# Patient Record
Sex: Female | Born: 1950 | Race: White | Hispanic: No | Marital: Married | Smoking: Never smoker
Health system: Southern US, Community
[De-identification: ages and names within clinical notes are randomized; demographics above are authoritative.]

## PROBLEM LIST (undated history)

## (undated) DIAGNOSIS — E079 Disorder of thyroid, unspecified: Secondary | ICD-10-CM

## (undated) HISTORY — PX: MULTIPLE TOOTH EXTRACTIONS: SHX2053

## (undated) HISTORY — DX: Disorder of thyroid, unspecified: E07.9

## (undated) HISTORY — PX: TUBAL LIGATION: SHX77

---

## 1998-11-06 ENCOUNTER — Other Ambulatory Visit: Admission: RE | Admit: 1998-11-06 | Discharge: 1998-11-06 | Payer: Self-pay | Admitting: Gynecology

## 1999-11-21 ENCOUNTER — Encounter: Admission: RE | Admit: 1999-11-21 | Discharge: 1999-11-21 | Payer: Self-pay | Admitting: Family Medicine

## 1999-12-08 ENCOUNTER — Other Ambulatory Visit: Admission: RE | Admit: 1999-12-08 | Discharge: 1999-12-08 | Payer: Self-pay | Admitting: Gynecology

## 2000-09-10 ENCOUNTER — Encounter (INDEPENDENT_AMBULATORY_CARE_PROVIDER_SITE_OTHER): Payer: Self-pay | Admitting: *Deleted

## 2000-09-10 ENCOUNTER — Ambulatory Visit (HOSPITAL_BASED_OUTPATIENT_CLINIC_OR_DEPARTMENT_OTHER): Admission: RE | Admit: 2000-09-10 | Discharge: 2000-09-10 | Payer: Self-pay | Admitting: Surgery

## 2000-11-24 ENCOUNTER — Encounter: Payer: Self-pay | Admitting: Gynecology

## 2000-11-24 ENCOUNTER — Encounter: Admission: RE | Admit: 2000-11-24 | Discharge: 2000-11-24 | Payer: Self-pay | Admitting: Gynecology

## 2001-04-12 ENCOUNTER — Other Ambulatory Visit: Admission: RE | Admit: 2001-04-12 | Discharge: 2001-04-12 | Payer: Self-pay | Admitting: Gynecology

## 2002-05-05 ENCOUNTER — Other Ambulatory Visit: Admission: RE | Admit: 2002-05-05 | Discharge: 2002-05-05 | Payer: Self-pay | Admitting: Gynecology

## 2002-05-12 ENCOUNTER — Encounter: Admission: RE | Admit: 2002-05-12 | Discharge: 2002-05-12 | Payer: Self-pay | Admitting: Family Medicine

## 2002-05-12 ENCOUNTER — Encounter: Payer: Self-pay | Admitting: Family Medicine

## 2003-05-18 ENCOUNTER — Encounter: Payer: Self-pay | Admitting: Family Medicine

## 2003-05-18 ENCOUNTER — Encounter: Admission: RE | Admit: 2003-05-18 | Discharge: 2003-05-18 | Payer: Self-pay | Admitting: Family Medicine

## 2003-06-20 ENCOUNTER — Other Ambulatory Visit: Admission: RE | Admit: 2003-06-20 | Discharge: 2003-06-20 | Payer: Self-pay | Admitting: Gynecology

## 2003-06-21 ENCOUNTER — Encounter: Payer: Self-pay | Admitting: Family Medicine

## 2003-06-21 ENCOUNTER — Encounter: Admission: RE | Admit: 2003-06-21 | Discharge: 2003-06-21 | Payer: Self-pay | Admitting: Family Medicine

## 2004-06-06 ENCOUNTER — Encounter: Admission: RE | Admit: 2004-06-06 | Discharge: 2004-06-06 | Payer: Self-pay | Admitting: Gynecology

## 2004-06-26 ENCOUNTER — Other Ambulatory Visit: Admission: RE | Admit: 2004-06-26 | Discharge: 2004-06-26 | Payer: Self-pay | Admitting: Gynecology

## 2004-12-21 HISTORY — PX: COLONOSCOPY: SHX174

## 2005-07-13 ENCOUNTER — Encounter: Admission: RE | Admit: 2005-07-13 | Discharge: 2005-07-13 | Payer: Self-pay | Admitting: Family Medicine

## 2005-07-31 ENCOUNTER — Encounter: Admission: RE | Admit: 2005-07-31 | Discharge: 2005-07-31 | Payer: Self-pay | Admitting: Family Medicine

## 2005-09-04 ENCOUNTER — Other Ambulatory Visit: Admission: RE | Admit: 2005-09-04 | Discharge: 2005-09-04 | Payer: Self-pay | Admitting: Family Medicine

## 2006-07-16 ENCOUNTER — Encounter: Admission: RE | Admit: 2006-07-16 | Discharge: 2006-07-16 | Payer: Self-pay | Admitting: Family Medicine

## 2006-07-22 ENCOUNTER — Encounter: Admission: RE | Admit: 2006-07-22 | Discharge: 2006-07-22 | Payer: Self-pay | Admitting: Family Medicine

## 2006-07-30 ENCOUNTER — Ambulatory Visit: Payer: Self-pay | Admitting: Gastroenterology

## 2006-08-06 ENCOUNTER — Ambulatory Visit: Payer: Self-pay | Admitting: Gastroenterology

## 2006-10-15 ENCOUNTER — Other Ambulatory Visit: Admission: RE | Admit: 2006-10-15 | Discharge: 2006-10-15 | Payer: Self-pay | Admitting: Family Medicine

## 2007-07-22 ENCOUNTER — Encounter: Admission: RE | Admit: 2007-07-22 | Discharge: 2007-07-22 | Payer: Self-pay | Admitting: Family Medicine

## 2007-10-20 ENCOUNTER — Other Ambulatory Visit: Admission: RE | Admit: 2007-10-20 | Discharge: 2007-10-20 | Payer: Self-pay | Admitting: Family Medicine

## 2008-09-06 ENCOUNTER — Encounter: Payer: Self-pay | Admitting: Family Medicine

## 2008-09-06 ENCOUNTER — Encounter: Admission: RE | Admit: 2008-09-06 | Discharge: 2008-09-06 | Payer: Self-pay | Admitting: Family Medicine

## 2008-10-19 ENCOUNTER — Encounter (INDEPENDENT_AMBULATORY_CARE_PROVIDER_SITE_OTHER): Payer: Self-pay | Admitting: *Deleted

## 2008-10-19 ENCOUNTER — Encounter: Payer: Self-pay | Admitting: Family Medicine

## 2008-10-19 LAB — CONVERTED CEMR LAB
Alkaline Phosphatase: 61 units/L
CO2, serum: 23 mmol/L
Creatinine, Ser: 0.96 mg/dL
HCT: 40.5 %
HDL: 101 mg/dL
LDL Cholesterol: 129 mg/dL
MCH: 33.1 pg
RBC count: 4.48 10*6/uL
Sodium, serum: 140 mmol/L
TSH: 0.75 microintl units/mL
Total Bilirubin: 0.6 mg/dL
Total Protein: 7.5 g/dL
WBC, blood: 5.1 10*3/uL

## 2008-10-23 ENCOUNTER — Other Ambulatory Visit: Admission: RE | Admit: 2008-10-23 | Discharge: 2008-10-23 | Payer: Self-pay | Admitting: Family Medicine

## 2009-09-03 ENCOUNTER — Ambulatory Visit: Payer: Self-pay | Admitting: Family Medicine

## 2009-09-03 DIAGNOSIS — E039 Hypothyroidism, unspecified: Secondary | ICD-10-CM | POA: Insufficient documentation

## 2009-09-03 DIAGNOSIS — M810 Age-related osteoporosis without current pathological fracture: Secondary | ICD-10-CM | POA: Insufficient documentation

## 2009-09-05 ENCOUNTER — Encounter (INDEPENDENT_AMBULATORY_CARE_PROVIDER_SITE_OTHER): Payer: Self-pay | Admitting: *Deleted

## 2009-11-18 ENCOUNTER — Telehealth (INDEPENDENT_AMBULATORY_CARE_PROVIDER_SITE_OTHER): Payer: Self-pay | Admitting: *Deleted

## 2009-12-27 ENCOUNTER — Encounter: Payer: Self-pay | Admitting: Family Medicine

## 2009-12-27 ENCOUNTER — Ambulatory Visit: Payer: Self-pay | Admitting: Family Medicine

## 2009-12-27 ENCOUNTER — Other Ambulatory Visit: Admission: RE | Admit: 2009-12-27 | Discharge: 2009-12-27 | Payer: Self-pay | Admitting: Family Medicine

## 2009-12-27 LAB — HM COLONOSCOPY: HM Colonoscopy: NORMAL

## 2009-12-28 ENCOUNTER — Encounter: Payer: Self-pay | Admitting: Family Medicine

## 2009-12-31 ENCOUNTER — Encounter (INDEPENDENT_AMBULATORY_CARE_PROVIDER_SITE_OTHER): Payer: Self-pay | Admitting: *Deleted

## 2010-01-01 ENCOUNTER — Encounter (INDEPENDENT_AMBULATORY_CARE_PROVIDER_SITE_OTHER): Payer: Self-pay | Admitting: *Deleted

## 2010-01-01 LAB — CONVERTED CEMR LAB: Vit D, 25-Hydroxy: 26 ng/mL — ABNORMAL LOW (ref 30–89)

## 2010-01-03 ENCOUNTER — Encounter (INDEPENDENT_AMBULATORY_CARE_PROVIDER_SITE_OTHER): Payer: Self-pay | Admitting: *Deleted

## 2010-01-06 ENCOUNTER — Encounter (INDEPENDENT_AMBULATORY_CARE_PROVIDER_SITE_OTHER): Payer: Self-pay | Admitting: *Deleted

## 2010-01-06 LAB — CONVERTED CEMR LAB
ALT: 16 units/L (ref 0–35)
AST: 20 units/L (ref 0–37)
Alkaline Phosphatase: 63 units/L (ref 39–117)
Calcium: 9.9 mg/dL (ref 8.4–10.5)
Creatinine, Ser: 0.9 mg/dL (ref 0.4–1.2)
Glucose, Bld: 86 mg/dL (ref 70–99)
HCT: 42.4 % (ref 36.0–46.0)
Lymphocytes Relative: 30.2 % (ref 12.0–46.0)
Lymphs Abs: 1.6 10*3/uL (ref 0.7–4.0)
MCV: 93.7 fL (ref 78.0–100.0)
Monocytes Relative: 9.4 % (ref 3.0–12.0)
Neutrophils Relative %: 58.7 % (ref 43.0–77.0)
Platelets: 329 10*3/uL (ref 150.0–400.0)
RDW: 12.1 % (ref 11.5–14.6)
Sodium: 142 meq/L (ref 135–145)
Triglycerides: 89 mg/dL (ref 0.0–149.0)
VLDL: 17.8 mg/dL (ref 0.0–40.0)

## 2010-08-15 ENCOUNTER — Encounter: Admission: RE | Admit: 2010-08-15 | Discharge: 2010-08-15 | Payer: Self-pay | Admitting: Family Medicine

## 2010-08-15 LAB — HM MAMMOGRAPHY: HM Mammogram: NORMAL

## 2011-01-11 ENCOUNTER — Encounter: Payer: Self-pay | Admitting: Family Medicine

## 2011-01-14 ENCOUNTER — Encounter: Payer: Self-pay | Admitting: Family Medicine

## 2011-01-20 NOTE — Letter (Signed)
Summary: Results Follow up Letter  [facility]  [address] [place], [place] [address]   Phone: [phone]  Fax: [phone]    01/03/2010 MRN: [id number]  Kerri Garrett [address] [place], [place]  [address]  Dear Ms. Curb,  The following are the results of your recent test(s):  Test         Result    Pap Smear:        Normal __X___  Not Normal _____ Comments: ______________________________________________________ Cholesterol: LDL(Bad cholesterol):         Your goal is less than:         HDL (Good cholesterol):       Your goal is more than: Comments:  ______________________________________________________ Mammogram:        Normal _____  Not Normal _____ Comments:  ___________________________________________________________________ Hemoccult:        Normal _____  Not normal _______ Comments:    _____________________________________________________________________ Other Tests:    We routinely do not discuss normal results over the telephone.  If you desire a copy of the results, or you have any questions about this information we can discuss them at your next office visit.   Sincerely,    Army Fossa CMA  January 03, 2010 7:56 AM

## 2011-01-20 NOTE — Letter (Signed)
Summary: Results Follow up Letter  [facility]  [address] [place], [place] [address]   Phone: [phone]  Fax: [phone]    12/31/2009 MRN: [id number]  Kerri Garrett [address] [place], [place]  [address]  Dear Ms. Stalvey,  The following are the results of your recent test(s):  Test         Result    Pap Smear:        Normal _____  Not Normal _____ Comments: ______________________________________________________ Cholesterol: LDL(Bad cholesterol):         Your goal is less than:         HDL (Good cholesterol):       Your goal is more than: Comments:  ______________________________________________________ Mammogram:        Normal _____  Not Normal _____ Comments:  ___________________________________________________________________ Hemoccult:        Normal _____  Not normal _______ Comments:    _____________________________________________________________________ Other Tests:  See attachment for results.   We routinely do not discuss normal results over the telephone.  If you desire a copy of the results, or you have any questions about this information we can discuss them at your next office visit.   Sincerely,    Army Fossa CMA  December 31, 2009 11:35 AM

## 2011-01-20 NOTE — Letter (Signed)
Summary: Results Follow up Letter  [facility]  [address] [place], [place] [address]   Phone: [phone]  Fax: [phone]    01/01/2010 MRN: [id number]  Kerri Garrett [address] [place], [place]  [address]  Dear Ms. Rasco,  The following are the results of your recent test(s):  Test         Result    Pap Smear:        Normal _____  Not Normal _____ Comments: ______________________________________________________ Cholesterol: LDL(Bad cholesterol):         Your goal is less than:         HDL (Good cholesterol):       Your goal is more than: Comments:  ______________________________________________________ Mammogram:        Normal _____  Not Normal _____ Comments:  ___________________________________________________________________ Hemoccult:        Normal _____  Not normal _______ Comments:    _____________________________________________________________________ Other Tests:  See attachment for results.   We routinely do not discuss normal results over the telephone.  If you desire a copy of the results, or you have any questions about this information we can discuss them at your next office visit.   Sincerely,    Army Fossa CMA  January 01, 2010 2:02 PM

## 2011-01-20 NOTE — Assessment & Plan Note (Signed)
Summary: CPX AND FASTING LABS///[facility]   Vital Signs:  Patient profile:   60 year old female Height:      62.75 inches Weight:      112 pounds Temp:     98.1 degrees F oral Pulse rate:   64 / minute Pulse rhythm:   regular BP sitting:   110 / 60  (left arm) Cuff size:   regular  Vitals Entered By: Army Fossa CMA (December 27, 2009 8:55 AM) CC: CPX, pap. No complaints.    History of Present Illness: Pt here for cpe, labs and pap.    No complaints.    Preventive Screening-Counseling & Management  Alcohol-Tobacco     Alcohol drinks/day: <1     Smoking Status: never  Caffeine-Diet-Exercise     Caffeine use/day: 2     Does Patient Exercise: no     Exercise Counseling: to improve exercise regimen  Hep-HIV-STD-Contraception     Dental Visit-last 6 months yes     Dental Care Counseling: not indicated; dental care within six months     SBE monthly: yes     SBE Education/Counseling: not indicated; SBE done regularly      Sexual History:  currently monogamous and married.        Drug Use:  never.    Current Medications (verified): 1)  Synthroid 75 Mcg Tabs (Levothyroxine Sodium) .... Take One Tablet Daily 2)  Actonel 150 Mg Tabs (Risedronate Sodium) .... Take One Tablet Monthly 3)  Calcium .... 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 CAD-paternal grandfather,maternal grandfather deceased MI HTN-mother DM-no STROKE-maternal grandmother COLON CA-no BREAST CA-no  Mother w/ CEA, osteoporosis  Social History: Reviewed history from 09/03/2009 and no changes required. married 2 children- son in [place], daughter in [place], [place] 3 step children manages Dental PracticeCaffeine use/day:  2 Dental Care w/in 6 mos.:  yes Sexual History:  currently monogamous, married  Review of Systems      See HPI General:  Denies chills, fatigue, fever, loss of appetite, malaise, sleep disorder, sweats, weakness, and weight loss. Eyes:  Denies blurring, discharge, double vision, eye irritation, eye pain, halos, itching, light sensitivity, red eye, vision loss-1 eye, and vision loss-both eyes; optho-- q2y,  retinal specialist q2y. ENT:  Denies decreased hearing, difficulty swallowing, ear discharge, earache, hoarseness, nasal congestion, nosebleeds, postnasal drainage, ringing in ears, sinus pressure, and sore throat. CV:  Denies bluish discoloration of lips or nails, chest pain or discomfort, difficulty breathing at night, difficulty breathing while lying down, fainting, fatigue, leg cramps with exertion, lightheadness, near fainting, palpitations, shortness of breath with exertion, swelling of feet, swelling of hands, and weight gain. Resp:  Denies chest discomfort, chest pain with inspiration, cough, coughing up blood, excessive snoring, hypersomnolence, morning headaches, pleuritic, shortness of breath, sputum productive, and wheezing. GI:  Denies abdominal pain, bloody stools, change in  bowel habits, constipation, dark tarry stools, diarrhea, excessive appetite, gas, hemorrhoids, indigestion, loss of appetite, nausea, vomiting, vomiting blood, and yellowish skin  color. GU:  Denies abnormal vaginal bleeding, decreased libido, discharge, dysuria, genital sores, hematuria, incontinence, nocturia, urinary frequency, and urinary hesitancy. MS:  Denies joint pain, joint redness, joint swelling, loss of strength, low back pain, mid back pain, muscle aches, muscle , cramps, muscle weakness, stiffness, and thoracic pain. Derm:  Denies changes in color of skin, changes in nail beds, dryness, excessive perspiration, flushing, hair loss, insect bite(s), itching, lesion(s), poor wound healing, and rash. Neuro:  Denies brief paralysis, difficulty with concentration, disturbances in coordination, falling down, headaches, inability to speak, memory loss, numbness, poor balance, seizures, sensation of room spinning, tingling, tremors, visual disturbances, and weakness. Psych:  Denies alternate hallucination ( auditory/visual), anxiety, depression, easily angered, easily tearful, irritability, mental problems, panic attacks, sense of great danger, suicidal thoughts/plans, thoughts of violence, unusual visions or sounds, and thoughts /plans of harming others. Endo:  Denies cold intolerance, excessive hunger, excessive thirst, excessive urination, heat intolerance, polyuria, and weight change. Heme:  Denies abnormal bruising, bleeding, enlarge lymph nodes, fevers, pallor, and skin discoloration. Allergy:  Denies hives or rash, itching eyes, persistent infections, seasonal allergies, and sneezing.  Physical Exam  General:  Well-developed,well-nourished,in no acute distress; alert,appropriate and cooperative throughout examination Head:  Normocephalic and atraumatic without obvious abnormalities. No apparent alopecia or balding. Eyes:  vision grossly intact, pupils equal, pupils round, pupils reactive to light, and no injection.   Ears:  External ear exam shows no significant lesions or deformities.  Otoscopic examination reveals clear canals, tympanic membranes are intact  bilaterally without bulging, retraction, inflammation or discharge. Hearing is grossly normal bilaterally. Nose:  External nasal examination shows no deformity or inflammation. Nasal mucosa are pink and moist without lesions or exudates. Mouth:  Oral mucosa and oropharynx without lesions or exudates.  Teeth in good repair. Neck:  No deformities, masses, or tenderness noted.no carotid bruits.   Chest Wall:  No deformities, masses, or tenderness noted. Breasts:  No mass, nodules, thickening, tenderness, bulging, retraction, inflamation, nipple discharge or skin changes noted.   Lungs:  Normal respiratory effort, chest expands symmetrically. Lungs are clear to auscultation, no crackles or wheezes. Heart:  normal rate and no murmur.   Abdomen:  Bowel sounds positive,abdomen soft and non-tender without masses, organomegaly or hernias noted. Rectal:  No external abnormalities noted. Normal sphincter tone. No rectal masses or tenderness. Genitalia:  Pelvic Exam:        External: normal female genitalia without lesions or masses        Vagina: normal without lesions or masses        Cervix: normal without lesions or masses        Adnexa: normal bimanual exam without masses or fullness        Uterus: normal by palpation        Pap smear: performed Msk:  normal ROM, no joint tenderness, no joint swelling, no joint warmth, no redness over joints, no joint deformities, no joint instability, and no crepitation.   Pulses:  R posterior tibial normal, R dorsalis pedis normal, R carotid normal, L posterior tibial normal, L dorsalis pedis normal, and L carotid normal.   Extremities:  No clubbing, cyanosis, edema, or deformity noted with normal full range of motion of all joints.   Neurologic:  No cranial nerve deficits noted. Station and gait are normal. Plantar reflexes are down-going bilaterally. DTRs are symmetrical throughout.  Sensory, motor and coordinative functions appear intact. Skin:  Intact without  suspicious lesions or rashes Cervical Nodes:  No lymphadenopathy noted Axillary Nodes:  No palpable lymphadenopathy Psych:  Cognition and judgment appear intact. Alert and cooperative with normal attention span and concentration. No apparent delusions, illusions, hallucinations   Impression & Recommendations:  Problem # 1:  PREVENTIVE HEALTH CARE (ICD-V70.0) ghm utd pt states she had tetanus with Dr Artis Flock Orders: Venipuncture (54098) TLB-Lipid Panel (80061-LIPID) TLB-BMP (Basic Metabolic Panel-BMET) (80048-METABOL) TLB-CBC Platelet - w/Differential (85025-CBCD) TLB-Hepatic/Liver Function Pnl (80076-HEPATIC) TLB-TSH (Thyroid Stimulating Hormone) (84443-TSH) T-Vitamin D (25-Hydroxy) (11914-78295)  Problem # 2:  HYPOTHYROIDISM (ICD-244.9)  Orders: Venipuncture (62130) TLB-Lipid Panel (80061-LIPID) TLB-BMP (Basic Metabolic Panel-BMET) (80048-METABOL) TLB-CBC Platelet - w/Differential (85025-CBCD) TLB-Hepatic/Liver Function Pnl (80076-HEPATIC) TLB-TSH (Thyroid Stimulating Hormone) (84443-TSH) T-Vitamin D (25-Hydroxy) (86578-46962)  Her updated medication list for this problem includes:    Synthroid 75 Mcg Tabs (Levothyroxine sodium) .Marland Kitchen... Take one tablet daily  Labs Reviewed: TSH: 0.750 (10/19/2008)    Chol: 241 (10/19/2008)   HDL: 101 (10/19/2008)   LDL: 129 (10/19/2008)   TG: 57 (10/19/2008)  Problem # 3:  OSTEOPOROSIS (ICD-733.00)  The following medications were removed from the medication list:    Actonel 150 Mg Tabs (Risedronate sodium) .Marland Kitchen... Take one tablet monthly Her updated medication list for this problem includes:    Calcium Gluconate 500 Mg Tabs (Calcium gluconate) .Marland Kitchen... 1 two times a  day    pt states there is vita D in her calcium     BMD density due in fall Orders: Venipuncture (95284) TLB-Lipid Panel (80061-LIPID) TLB-BMP (Basic Metabolic Panel-BMET) (80048-METABOL) TLB-CBC Platelet - w/Differential (85025-CBCD) TLB-Hepatic/Liver Function Pnl  (80076-HEPATIC) TLB-TSH (Thyroid Stimulating Hormone) (84443-TSH) T-Vitamin D (25-Hydroxy) (13244-01027)  Bone Density: normal (09/06/2008)  Complete Medication List: 1)  Synthroid 75 Mcg Tabs (Levothyroxine sodium) .... Take one tablet daily 2)  Calcium Gluconate 500 Mg Tabs (Calcium gluconate) .Marland Kitchen.. 1 two times a day Prescriptions: SYNTHROID 75 MCG TABS (LEVOTHYROXINE SODIUM) take one tablet daily  #30 x 11   Entered and Authorized by:   Loreen Freud DO   Signed by:   Loreen Freud DO on 12/27/2009   Method used:   Electronically to        [facility] [phone]* (retail)       [address]       [place], [place]  [address]       Ph: [phone] or [phone]       Fax: [phone]   RxID:   [id number]    EKG  Procedure date:  12/27/2009  Findings:      Normal sinus rhythm with rate of:  68 bpm    Immunization History:  Influenza Immunization History:    Influenza:  Fluvax 3+ (10/09/2009)    Colonoscopy Result Date:  01/07/2005 Colonoscopy Result:  normal Colonoscopy Next Due:  10 yr    Appended Document: CPX AND FASTING LABS///[facility]  Laboratory Results   Urine Tests   Date/Time Reported: December 27, 2009 1:50 PM   Routine Urinalysis   Color: yellow Appearance: Cloudy Glucose: negative   (Normal Range: Negative) Bilirubin: negative   (Normal Range: Negative) Ketone: negative   (Normal Range: Negative) Spec. Gravity: 1.015   (Normal Range: 1.003-1.035) Blood: moderate   (Normal Range: Negative) pH: 5.0   (Normal Range: 5.0-8.0) Protein: negative   (Normal Range: Negative) Urobilinogen: negative   (Normal Range: 0-1) Nitrite: negative   (Normal Range: Negative) Leukocyte Esterace: negative   (Normal  Range: Negative)    Comments: Floydene Flock  December 27, 2009 1:51 PM cx sent

## 2011-01-20 NOTE — Letter (Signed)
Summary: Results Follow up Letter  [facility]  [address] [place], [place] [address]   Phone: [phone]  Fax: [phone]    01/06/2010 MRN: [id number]  [name] [address] [place], [place]  [address]  Dear Ms. Traister,  The following are the results of your recent test(s):  Test         Result    Pap Smear:        Normal _____  Not Normal _____ Comments: ______________________________________________________ Cholesterol: LDL(Bad cholesterol):         Your goal is less than:         HDL (Good cholesterol):       Your goal is more than: Comments:  ______________________________________________________ Mammogram:        Normal _____  Not Normal _____ Comments:  ___________________________________________________________________ Hemoccult:        Normal _____  Not normal _______ Comments:    _____________________________________________________________________ Other Tests:  See attachment for results.   We routinely do not discuss normal results over the telephone.  If you desire a copy of the results, or you have any questions about this information we can discuss them at your next office visit.   Sincerely,    Army Fossa CMA  January 06, 2010 8:02 AM

## 2011-01-20 NOTE — Letter (Signed)
Summary: Results Follow up Letter  [facility]  [address] [place], [place] [address]   Phone: [phone]  Fax: [phone]    01/01/2010 MRN: [id number]  Kerri Garrett [address] [place], [place]  [address]  Dear [name],  The following are the results of your recent test(s):  Test         Result    Pap Smear:        Normal __X___  Not Normal _____ Comments: ______________________________________________________ Cholesterol: LDL(Bad cholesterol):         Your goal is less than:         HDL (Good cholesterol):       Your goal is more than: Comments:  ______________________________________________________ Mammogram:        Normal _____  Not Normal _____ Comments:  ___________________________________________________________________ Hemoccult:        Normal _____  Not normal _______ Comments:    _____________________________________________________________________ Other Tests:    We routinely do not discuss normal results over the telephone.  If you desire a copy of the results, or you have any questions about this information we can discuss them at your next office visit.   Sincerely,    Army Fossa CMA  January 01, 2010 2:30 PM

## 2011-01-22 NOTE — Letter (Signed)
Summary: Primary Care Appointment Letter  [facility]  [address] [place], [place] [address]   Phone: [phone]  Fax: [phone]    01/14/2011 MRN: [id number]  Kerri Garrett [address] [place], [place]  [address]  Dear Kerri Garrett,   Your Primary Care Physician Loreen Freud DO has indicated that:    __X_____it is time to schedule an appointment for a Physical Exam with fasting Labs.    _______you missed your appointment on______ and need to call and          reschedule.    _______you need to have lab work done.    _______you need to schedule an appointment discuss lab or test results.    _______you need to call to reschedule your appointment that is                       scheduled on _________.     Please call our office as soon as possible. Our phone number is [phone]. Please press option 1. Our office is open 8a-12noon and 1p-5p, Monday through Friday.     Thank you,     Primary Care Scheduler

## 2011-02-06 ENCOUNTER — Encounter: Payer: Self-pay | Admitting: Family Medicine

## 2011-02-13 ENCOUNTER — Encounter (INDEPENDENT_AMBULATORY_CARE_PROVIDER_SITE_OTHER): Payer: 59 | Admitting: Family Medicine

## 2011-02-13 ENCOUNTER — Other Ambulatory Visit (HOSPITAL_COMMUNITY)
Admission: RE | Admit: 2011-02-13 | Discharge: 2011-02-13 | Disposition: A | Discharge status: Home / Self Care | Payer: 59 | Source: Ambulatory Visit | Attending: Family Medicine | Admitting: Family Medicine

## 2011-02-13 ENCOUNTER — Other Ambulatory Visit: Payer: Self-pay | Admitting: Family Medicine

## 2011-02-13 ENCOUNTER — Encounter: Payer: Self-pay | Admitting: Family Medicine

## 2011-02-13 DIAGNOSIS — E039 Hypothyroidism, unspecified: Secondary | ICD-10-CM

## 2011-02-13 DIAGNOSIS — Z0000 Encounter for general adult medical examination without abnormal findings: Secondary | ICD-10-CM

## 2011-02-13 DIAGNOSIS — M810 Age-related osteoporosis without current pathological fracture: Secondary | ICD-10-CM

## 2011-02-13 DIAGNOSIS — Z01419 Encounter for gynecological examination (general) (routine) without abnormal findings: Secondary | ICD-10-CM | POA: Insufficient documentation

## 2011-02-13 DIAGNOSIS — E785 Hyperlipidemia, unspecified: Secondary | ICD-10-CM

## 2011-02-13 LAB — LIPID PANEL
Total CHOL/HDL Ratio: 2
Triglycerides: 41 mg/dL (ref 0.0–149.0)
VLDL: 8.2 mg/dL (ref 0.0–40.0)

## 2011-02-13 LAB — CBC WITH DIFFERENTIAL/PLATELET
Basophils Relative: 0.5 % (ref 0.0–3.0)
Eosinophils Absolute: 0.1 10*3/uL (ref 0.0–0.7)
HCT: 39.4 % (ref 36.0–46.0)
Hemoglobin: 13.4 g/dL (ref 12.0–15.0)
Lymphs Abs: 1.7 10*3/uL (ref 0.7–4.0)
MCV: 90.2 fl (ref 78.0–100.0)
Monocytes Absolute: 0.4 10*3/uL (ref 0.1–1.0)
Platelets: 321 10*3/uL (ref 150.0–400.0)

## 2011-02-13 LAB — HEPATIC FUNCTION PANEL
Alkaline Phosphatase: 65 U/L (ref 39–117)
Bilirubin, Direct: 0.1 mg/dL (ref 0.0–0.3)
Total Bilirubin: 0.5 mg/dL (ref 0.3–1.2)

## 2011-02-13 LAB — BASIC METABOLIC PANEL
BUN: 16 mg/dL (ref 6–23)
Calcium: 9.7 mg/dL (ref 8.4–10.5)
Creatinine, Ser: 1 mg/dL (ref 0.4–1.2)
Sodium: 141 mEq/L (ref 135–145)

## 2011-02-13 LAB — HM PAP SMEAR: HM Pap smear: NORMAL

## 2011-02-16 ENCOUNTER — Telehealth: Payer: Self-pay | Admitting: Family Medicine

## 2011-02-17 ENCOUNTER — Other Ambulatory Visit: Payer: Self-pay | Admitting: Family Medicine

## 2011-02-17 DIAGNOSIS — Z1231 Encounter for screening mammogram for malignant neoplasm of breast: Secondary | ICD-10-CM

## 2011-02-17 NOTE — Assessment & Plan Note (Signed)
Summary: physical and fasting labs///sph    [phone]  ext [phone]   Vital Signs:  Patient profile:   60 year old female Height:      62.75 inches Weight:      111 pounds BMI:     19.89 Temp:     98.3 degrees F oral Pulse rate:   68 / minute Resp:     20 per minute BP sitting:   110 / 58  (left arm)  Vitals Entered By: Jeremy Johann CMA (February 13, 2011 8:08 AM) CC: cpx, fasting,pap   History of Present Illness: 60 yo woman here today for CPE.  no concerns today.  last DEXA 2 yrs ago.  Mammogram done in Oct.  Preventive Screening-Counseling & Management  Alcohol-Tobacco     Alcohol drinks/day: <1     Alcohol type: wine     Smoking Status: never  Caffeine-Diet-Exercise     Does Patient Exercise: no      Sexual History:  currently monogamous.        Drug Use:  never.    Current Medications (verified): 1)  Synthroid 75 Mcg Tabs (Levothyroxine Sodium) .... Take One Tablet Daily 2)  Calcium Gluconate 500 Mg Tabs (Calcium Gluconate) .Marland Kitchen.. 1 Two Times A Day 3)  Vitamin D 2000 Unit Tabs (Cholecalciferol) .... Take 1 Tab Once Daily  Allergies (verified): No Known Drug Allergies  Past History:  Past medical, surgical, family and social histories (including risk factors) reviewed, and no changes noted (except as noted below).  Past Medical History: Reviewed history from 09/03/2009 and no changes required. Hyperthyroidism Osteoporosis  Past Surgical History: Reviewed history from 09/03/2009 and no changes required. Tubal ligation Lipoma removed   Family History: Reviewed history from 09/03/2009 and no changes required. CAD-paternal grandfather,maternal grandfather deceased MI HTN-mother DM-no STROKE-maternal grandmother COLON CA-no BREAST CA-no  Mother w/ CEA, osteoporosis  Social History: Reviewed history from 09/03/2009 and no changes required. married 2 children- son in [place], daughter in [place], [place] 3 step children manages Dental PracticeSexual  History:  currently monogamous  Review of Systems  The patient denies anorexia, fever, weight loss, weight gain, vision loss, decreased hearing, hoarseness, chest pain, syncope, dyspnea on exertion, peripheral edema, prolonged cough, headaches, abdominal pain, melena, hematochezia, severe indigestion/heartburn, hematuria, suspicious skin lesions, depression, abnormal bleeding, enlarged lymph nodes, and breast masses.    Physical Exam  General:  Well-developed,well-nourished,in no acute distress; alert,appropriate and cooperative throughout examination Head:  Normocephalic and atraumatic without obvious abnormalities. No apparent alopecia or balding. Eyes:  vision grossly intact, pupils equal, pupils round, pupils reactive to light, and no injection.  fundi normal Ears:  External ear exam shows no significant lesions or deformities.  Otoscopic examination reveals clear canals, tympanic membranes are intact bilaterally without bulging, retraction, inflammation or discharge. Hearing is grossly normal bilaterally. Nose:  External nasal examination shows no deformity or inflammation. Nasal mucosa are pink and moist without lesions or exudates. Mouth:  Oral mucosa and oropharynx without lesions or exudates.  Teeth in good repair. Neck:  No deformities, masses, or tenderness noted.no carotid bruits.   Breasts:  No mass, nodules, thickening, tenderness, bulging, retraction, inflamation, nipple discharge or skin changes noted.   Lungs:  Normal respiratory effort, chest expands symmetrically. Lungs are clear to auscultation, no crackles or wheezes. Heart:  normal rate and no murmur.   Abdomen:  Bowel sounds positive,abdomen soft and non-tender without masses, organomegaly or hernias noted. Genitalia:  Pelvic Exam:  External: normal female genitalia without lesions or masses        Vagina: normal without lesions or masses        Cervix: normal without lesions or masses        Adnexa: normal  bimanual exam without masses or fullness        Uterus: normal by palpation        Pap smear: performed Pulses:  + 2 carotid, radial, DP Extremities:  No clubbing, cyanosis, edema, or deformity noted with normal full range of motion of all joints.   Neurologic:  No cranial nerve deficits noted. Station and gait are normal. Plantar reflexes are down-going bilaterally. DTRs are symmetrical throughout. Sensory, motor and coordinative functions appear intact. Skin:  Intact without suspicious lesions or rashes Cervical Nodes:  No lymphadenopathy noted Axillary Nodes:  No palpable lymphadenopathy Psych:  Cognition and judgment appear intact. Alert and cooperative with normal attention span and concentration. No apparent delusions, illusions, hallucinations   Impression & Recommendations:  Problem # 1:  PREVENTIVE HEALTH CARE (ICD-V70.0) Assessment Unchanged PE WNL.  check labs.  anticipatory guidance provided.  UTD on colonoscopy and mammogram.  will place order for DEXA when pt gets Mammo in Oct. Orders: TLB-Lipid Panel (80061-LIPID) TLB-BMP (Basic Metabolic Panel-BMET) (80048-METABOL) TLB-CBC Platelet - w/Differential (85025-CBCD) TLB-Hepatic/Liver Function Pnl (80076-HEPATIC) Specimen Handling (21308)  Complete Medication List: 1)  Synthroid 75 Mcg Tabs (Levothyroxine sodium) .... Take one tablet daily 2)  Calcium Gluconate 500 Mg Tabs (Calcium gluconate) .Marland Kitchen.. 1 two times a day 3)  Vitamin D 2000 Unit Tabs (Cholecalciferol) .... Take 1 tab once daily  Other Orders: Venipuncture (65784) TLB-TSH (Thyroid Stimulating Hormone) (84443-TSH) T-Vitamin D (25-Hydroxy) (69629-52841) Radiology Referral (Radiology)  Patient Instructions: 1)  Your exam looks great! 2)  We'll notify you of your lab results 3)  When you have your mammogram in Oct we'll also do a bone density 4)  Try and get regular exercise 5)  Call with any questions or concerns 6)  Enjoy Spring!   Orders Added: 1)   Venipuncture [36415] 2)  TLB-TSH (Thyroid Stimulating Hormone) [84443-TSH] 3)  TLB-Lipid Panel [80061-LIPID] 4)  TLB-BMP (Basic Metabolic Panel-BMET) [80048-METABOL] 5)  TLB-CBC Platelet - w/Differential [85025-CBCD] 6)  TLB-Hepatic/Liver Function Pnl [80076-HEPATIC] 7)  T-Vitamin D (25-Hydroxy) [32440-10272] 8)  Specimen Handling [99000] 9)  Radiology Referral [Radiology] 10)  Est. Patient 40-64 years [phone]

## 2011-02-26 NOTE — Progress Notes (Signed)
Summary: labs  Phone Note Outgoing Call   Call placed by: Doristine Devoid CMA,  February 16, 2011 3:15 PM Call placed to: Patient Summary of Call: Total cholesterol and LDL are both elevated.  Should start Simvastatin 20mg  nightly and recheck LFTs in 6-8 weeks.  will need f/u w/ me in 6 months to recheck lipids.  remainder of labs look good!   Follow-up for Phone Call        left message on machine ....Marland KitchenMarland KitchenDoristine Devoid CMA  February 16, 2011 3:15 PM   spoke w/ patient doesn't want to take cholesterol meds and if there is anything else she can take otc she would like to know and she is also going to work on diet and exercise....Marland KitchenMarland KitchenDoristine Devoid CMA  February 17, 2011 8:40 AM   Additional Follow-up for Phone Call Additional follow up Details #1::        she can start Red Yeast Rice and fish oil and then continue to work on diet and exercise Additional Follow-up by: Neena Rhymes MD,  February 17, 2011 8:49 AM    Additional Follow-up for Phone Call Additional follow up Details #2::    Patient requested she be called at [phone]. Patient notified of the above. Lucious Groves CMA  February 17, 2011 9:36 AM

## 2011-05-08 NOTE — Op Note (Signed)
[facility] [place]  Patient:    Kerri Garrett, Kerri Garrett                      MRN: [id number] Proc. Date: 09/10/00 Adm. Date:  [id number] Attending:  Bonnetta Barry CC:         Nadyne Coombes. Fontaine, M.D.   Operative Report  PREOPERATIVE DIAGNOSES: 1. Right axillary mass. 2. Left thigh cutaneous lesion.  POSTOPERATIVE DIAGNOSES: 1. Right axillary mass. 2. Left thigh cutaneous lesion.  OPERATION: 1. Excise 2.0 cm soft tissue mass, right axilla. 2. Excise 1.0 cm lesion left inner thigh.  SURGEON:  Velora Heckler, M.D.  ANESTHESIA:  Local with intravenous sedation per Bedelia Person, M.D.  ESTIMATED BLOOD LOSS:  Minimal.  PREPARATION:  Betadine.  COMPLICATIONS:  None.  INDICATIONS:  The patient is a 60 year old white female who presents with soft tissue mass under the right arm which has been present for several years.  It has now been slowly enlarging for the past few months.  It is causing her some discomfort secondary to pressure sensation.  The patient also notes a small cutaneous lesion on the left medial thigh which is also increased slightly in size and recent months.  The patient has a brother with malignant melanoma. She desires surgical excision of both lesions.  DESCRIPTION OF PROCEDURE:  The procedure is done in OR #4 at the [facility].  The patient was brought to the operating room and placed in the supine position on the operating room table.  Following administration of intravenous sedation, the patient was prepped and draped in the usual strict aseptic fashion.  After ascertaining that an adequate level of sedation had been obtained, the lesion in the right posterior axilla was anesthetized with local anesthetic.  A 2 cm incision was made with the #15 blade.  Subcutaneous mass was localized and excised.  This appears to be partially a small subcutaneous lipoma with some associated breast tissue.  It is excised completely  and submitted to pathology for review.  Hemostasis was obtained with the electrocautery.  Skin edges were reapproximated with interrupted 4-0 Vicryl interrupted sutures.  The wound was washed and dried and Benzoin and Steri-Strips were applied.  Next, the lesion in the left inner thigh was addressed.  Again, the skin was anesthetized with local anesthetic.  An elliptical incision was made so as to encompass the entire lesion.  Full thickness excision into the subcutaneous tissues is performed and hemostasis obtained with the electrocautery.  The skin edges were reapproximated with interrupted 4-0 Vicryl subcuticular sutures.  Benzoin and Steri-Strips were applied.  Sterile gauze dressings were applied to both wounds.  Specimens are submitted separately to pathology for review.  The patient is awakened from anesthesia and brought to the recovery room in stable condition.  The patient tolerated the procedure well. DD:  09/10/00 TD:  09/11/00 Job: [id number]

## 2011-05-14 ENCOUNTER — Encounter: Payer: Self-pay | Admitting: Family Medicine

## 2011-05-14 ENCOUNTER — Ambulatory Visit (INDEPENDENT_AMBULATORY_CARE_PROVIDER_SITE_OTHER): Payer: 59 | Admitting: Family Medicine

## 2011-05-14 VITALS — BP 112/60 | Temp 98.9°F | Wt 110.0 lb

## 2011-05-14 DIAGNOSIS — N390 Urinary tract infection, site not specified: Secondary | ICD-10-CM

## 2011-05-14 LAB — POCT URINALYSIS DIPSTICK
Nitrite, UA: NEGATIVE
Protein, UA: NEGATIVE

## 2011-05-14 MED ORDER — CIPROFLOXACIN HCL 500 MG PO TABS: 500.0000 mg | ORAL_TABLET | Freq: Two times a day (BID) | ORAL | Status: AC

## 2011-05-14 NOTE — Progress Notes (Signed)
  Subjective:    Kerri Garrett is a 60 y.o. female who complains of frequency, hematuria, incomplete bladder emptying and suprapubic pressure. She has had symptoms for 1 day. Patient also complains of back pain. Patient denies fever, stomach ache and vaginal discharge. Patient does not have a history of recurrent UTI. Patient does not have a history of pyelonephritis.   The following portions of the patient's history were reviewed and updated as appropriate: allergies, current medications, past family history, past medical history, past social history, past surgical history and problem list.  Review of Systems Pertinent items are noted in HPI.    Objective:    BP 112/60  Temp(Src) 98.9 F (37.2 C) (Oral)  Wt 110 lb (49.896 kg) General appearance: alert, cooperative, appears stated age and no distress Abdomen: soft, non-tender; bowel sounds normal; no masses,  no organomegaly  Laboratory:  Urine dipstick: small for hemoglobin and trace for leukocyte esterase.   Micro exam: not done.    Assessment:    UTI     Plan:    Medications: ciprofloxacin. Maintain adequate hydration. Follow up if symptoms not improving, and as needed.

## 2011-05-14 NOTE — Patient Instructions (Signed)
Urinary Tract Infection (UTI)   Infections of the urinary tract can start in several places. A bladder infection (cystitis), a kidney infection (pyelonephritis), and a prostate infection (prostatitis) are different types of urinary tract infections. They usually get better if treated with medicines (antibiotics) that kill germs. Take all the medicine until it is gone. You or your child may feel better in a few days, but TAKE ALL MEDICINE or the infection may not respond and may become more difficult to treat.   HOME CARE INSTRUCTIONS   Drink enough water and fluids to keep the urine clear or pale yellow. Cranberry juice is especially recommended, in addition to large amounts of water.   Avoid caffeine, tea, and carbonated beverages. They tend to irritate the bladder.   Alcohol may irritate the prostate.   Only take over-the-counter or prescription medicines for pain, discomfort, or fever as directed by your caregiver.   FINDING OUT THE RESULTS OF YOUR TEST   Not all test results are available during your visit. If your or your child's test results are not back during the visit, make an appointment with your caregiver to find out the results. Do not assume everything is normal if you have not heard from your caregiver or the medical facility. It is important for you to follow up on all test results.   TO PREVENT FURTHER INFECTIONS:   Empty the bladder often. Avoid holding urine for long periods of time.   After a bowel movement, women should cleanse from front to back. Use each tissue only once.   Empty the bladder before and after sexual intercourse.   SEEK MEDICAL CARE IF:   There is back pain.   You or your child has an oral temperature above 100.4.   Your baby is older than 3 months with a rectal temperature of 100.5º F (38.1° C) or higher for more than 1 day.   Your or your child's problems (symptoms) are no better in 3 days. Return sooner if you or your child is getting worse.   SEEK IMMEDIATE MEDICAL CARE IF:    There is severe back pain or lower abdominal pain.   You or your child develops chills.   You or your child has an oral temperature above 100.4, not controlled by medicine.   Your baby is older than 3 months with a rectal temperature of 102º F (38.9º C) or higher.   Your baby is 3 months old or younger with a rectal temperature of 100.4º F (38º C) or higher.   There is nausea or vomiting.   There is continued burning or discomfort with urination.   MAKE SURE YOU:   Understand these instructions.   Will watch this condition.   Will get help right away if you or your child is not doing well or gets worse.   Document Released: 09/16/2005 Document Re-Released: 03/03/2010   ExitCare® Patient Information ©2011 [employer].

## 2011-05-28 ENCOUNTER — Ambulatory Visit (INDEPENDENT_AMBULATORY_CARE_PROVIDER_SITE_OTHER): Payer: 59 | Admitting: Family Medicine

## 2011-05-28 ENCOUNTER — Encounter: Payer: Self-pay | Admitting: Family Medicine

## 2011-05-28 VITALS — BP 108/68 | HR 67 | Temp 97.3°F | Wt 110.2 lb

## 2011-05-28 DIAGNOSIS — N390 Urinary tract infection, site not specified: Secondary | ICD-10-CM

## 2011-05-28 LAB — POCT URINALYSIS DIPSTICK
Glucose, UA: NEGATIVE
Nitrite, UA: NEGATIVE
Spec Grav, UA: 1.02
Urobilinogen, UA: 1

## 2011-05-28 MED ORDER — NITROFURANTOIN MONOHYD MACRO 100 MG PO CAPS: 100.0000 mg | ORAL_CAPSULE | Freq: Two times a day (BID) | ORAL | Status: AC

## 2011-05-28 NOTE — Progress Notes (Signed)
  Subjective:    Kerri Garrett is a 60 y.o. female who complains of UTI --no symptoms. here for f/u. She has had symptoms for N/A. NA. Patient also complains of some suprapubic tenderness that never resolved from previous visit.. Patient denies back pain, congestion, cough, fever, headache, rhinitis, sorethroat and vaginal discharge. Patient does not have a history of recurrent UTI. Patient does not have a history of pyelonephritis.   The following portions of the patient's history were reviewed and updated as appropriate: allergies, current medications, past family history, past medical history, past social history, past surgical history and problem list.  Review of Systems Pertinent items are noted in HPI.    Objective:    BP 108/68  Pulse 67  Temp(Src) 97.3 F (36.3 C) (Oral)  Wt 110 lb 3.2 oz (49.986 kg)  SpO2 99% gen--AAOx3  NAD Psych-- appropriate  Laboratory:  Urine dipstick: trace for hemoglobin and trace for leukocyte esterase.   Micro exam: not done.    Assessment:    Acute cystitis and UTI     Plan:    Medications: nitrofurantoin. Maintain adequate hydration. Follow up if symptoms not improving, and as needed.

## 2011-05-29 ENCOUNTER — Encounter: Payer: Self-pay | Admitting: Family Medicine

## 2011-05-30 LAB — URINE CULTURE

## 2011-06-01 ENCOUNTER — Telehealth: Payer: Self-pay

## 2011-06-01 NOTE — Telephone Encounter (Addendum)
Message copied by Arnette Norris on Mon Jun 01, 2011 10:15 AM ------      Message from: Lelon Perla      Created: Sun May 31, 2011  8:10 PM                   ----- Message -----         From: Almeta Monas, CMA         Sent: 05/28/2011   2:51 PM           To: Lelon Perla, DO            Culture negative for UTI May need urology referral if still symptomatic

## 2011-06-01 NOTE — Telephone Encounter (Signed)
Left message to call back     KP 

## 2011-06-01 NOTE — Telephone Encounter (Signed)
Discuss with patient will call if still symptomatic

## 2011-07-21 ENCOUNTER — Telehealth: Payer: Self-pay | Admitting: *Deleted

## 2011-07-21 DIAGNOSIS — Z1231 Encounter for screening mammogram for malignant neoplasm of breast: Secondary | ICD-10-CM

## 2011-07-21 NOTE — Telephone Encounter (Signed)
Orders already placed.

## 2011-07-21 NOTE — Telephone Encounter (Signed)
Pt has pending appt schedule  09-04-11 for mammogram. Ok to put in orders

## 2011-07-21 NOTE — Telephone Encounter (Signed)
Ok for mammo 

## 2011-09-03 ENCOUNTER — Other Ambulatory Visit: Payer: Self-pay | Admitting: Family Medicine

## 2011-09-04 ENCOUNTER — Ambulatory Visit
Admission: RE | Admit: 2011-09-04 | Discharge: 2011-09-04 | Disposition: A | Discharge status: Home / Self Care | Payer: 59 | Source: Ambulatory Visit | Attending: Family Medicine | Admitting: Family Medicine

## 2011-09-04 ENCOUNTER — Other Ambulatory Visit: Payer: Self-pay | Admitting: Family Medicine

## 2011-09-04 DIAGNOSIS — Z1231 Encounter for screening mammogram for malignant neoplasm of breast: Secondary | ICD-10-CM

## 2011-09-07 ENCOUNTER — Telehealth: Payer: Self-pay

## 2011-09-07 NOTE — Telephone Encounter (Signed)
Message copied by Beverely Low on Mon Sep 07, 2011  9:29 AM ------      Message from: Sheliah Hatch      Created: Mon Sep 07, 2011  8:06 AM       She officially has osteoporosis.  We need an appt to discuss her possible tx options.  She is also overdue for her physical- last done in August 2011.

## 2011-09-07 NOTE — Telephone Encounter (Signed)
Left message on personally identified voicemail to advise pt to schedule CPE with PCP

## 2011-09-11 ENCOUNTER — Ambulatory Visit (INDEPENDENT_AMBULATORY_CARE_PROVIDER_SITE_OTHER): Payer: 59 | Admitting: Family Medicine

## 2011-09-11 ENCOUNTER — Encounter: Payer: Self-pay | Admitting: Family Medicine

## 2011-09-11 VITALS — BP 100/60 | Temp 98.7°F | Wt 108.4 lb

## 2011-09-11 DIAGNOSIS — M810 Age-related osteoporosis without current pathological fracture: Secondary | ICD-10-CM

## 2011-09-11 MED ORDER — RISEDRONATE SODIUM 35 MG PO TBEC: 1.0000 | DELAYED_RELEASE_TABLET | ORAL | Status: DC

## 2011-09-11 NOTE — Patient Instructions (Signed)
Schedule your complete physical for February or early March Start the [facility] weekly Call if there are any problems or concerns Continue regular calcium and Vit D Happy Fall!

## 2011-09-11 NOTE — Progress Notes (Signed)
  Subjective:    Patient ID: Kerri Garrett, female    DOB: [birth year], 60 y.o.   MRN: [id number]  HPI Osteoporosis- pt had DEXA done last week, showed T score of -3 and -3.1 at spine and hip.  Report indicates no significant change since 2003.  Was previously on Actonel for 5 yrs- stopped when she read about dental side effects.  Feels well.  Is not taking Ca+D regularly b/c 'i often forget'.  Pleased that she has not lost bone mass but fears what will happen as she continues to age.  Review of Systems For ROS see HPI     Objective:   Physical Exam  Vitals reviewed. Constitutional: She appears well-developed and well-nourished. No distress.  Skin: Skin is warm and dry.  Psychiatric: She has a normal mood and affect. Her behavior is normal. Judgment and thought content normal.          Assessment & Plan:

## 2011-09-13 NOTE — Assessment & Plan Note (Signed)
Reviewed options w/ pt.  She does not like the idea of a yearly infusion.  Concerned about meds b/c she has to take thyroid on empty stomach and reports she will have difficult time adding another med that requires similar planning.  Based on this, will add Atelvia since she can take this med w/ food.  Samples given to ensure pt doesn't have side effects prior to buying med.  Pt expressed understanding and is in agreement w/ plan.

## 2011-09-16 ENCOUNTER — Other Ambulatory Visit: Payer: Self-pay | Admitting: Neurology

## 2011-09-16 DIAGNOSIS — H532 Diplopia: Secondary | ICD-10-CM

## 2011-09-18 ENCOUNTER — Encounter: Payer: Self-pay | Admitting: Gastroenterology

## 2011-09-20 ENCOUNTER — Ambulatory Visit
Admission: RE | Admit: 2011-09-20 | Discharge: 2011-09-20 | Disposition: A | Discharge status: Home / Self Care | Payer: 59 | Source: Ambulatory Visit | Attending: Neurology | Admitting: Neurology

## 2011-09-20 DIAGNOSIS — H532 Diplopia: Secondary | ICD-10-CM

## 2011-09-20 MED ORDER — GADOBENATE DIMEGLUMINE 529 MG/ML IV SOLN
9.0000 mL | Freq: Once | INTRAVENOUS | Status: AC | PRN
  Administered 2011-09-20: 9 mL via INTRAVENOUS

## 2011-09-23 ENCOUNTER — Other Ambulatory Visit: Payer: 59

## 2011-09-23 ENCOUNTER — Ambulatory Visit: Payer: 59

## 2011-09-29 ENCOUNTER — Encounter: Payer: Self-pay | Admitting: Family Medicine

## 2011-10-05 ENCOUNTER — Telehealth: Payer: Self-pay | Admitting: *Deleted

## 2011-10-05 NOTE — Telephone Encounter (Signed)
Prior Auth approved until 09-21-12, approval letter scan to chart.

## 2011-10-06 ENCOUNTER — Other Ambulatory Visit: Payer: Self-pay | Admitting: Family Medicine

## 2011-10-16 ENCOUNTER — Telehealth: Payer: Self-pay | Admitting: Gastroenterology

## 2011-10-16 NOTE — Telephone Encounter (Signed)
Left message with pt's husband that her recall is for 5 years per Dr Jarold Motto, he is not here, but I assume it's because he found a Lipoma on 2007 exam.

## 2011-10-16 NOTE — Telephone Encounter (Signed)
Informed pt Dr Jarold Motto is not here, but he wrote and approved thee recall; I assume it was d/t the Lipoma. Pt requested I ask Dr Jarold Motto on Monday. Pt will be at work on Monday, [phone].

## 2011-10-19 ENCOUNTER — Other Ambulatory Visit: Payer: Self-pay | Admitting: *Deleted

## 2011-10-19 DIAGNOSIS — Z8371 Family history of colonic polyps: Secondary | ICD-10-CM

## 2011-10-19 DIAGNOSIS — Z83719 Family history of colon polyps, unspecified: Secondary | ICD-10-CM

## 2011-10-19 DIAGNOSIS — Z800 Family history of malignant neoplasm of digestive organs: Secondary | ICD-10-CM

## 2011-10-19 NOTE — Telephone Encounter (Signed)
Notified Kerri Garrett that Dr Jarold Motto stated her family hx determined she should have Colons q5 years. Kerri Garrett scheduled for pre visit on 10/27/11 at 4pm and her Direct COLON for 10/30/11 at 4pm. Kerri Garrett questioned her mom having colon ca,but she does have colons q 5 years; Kerri Garrett stated understanding.

## 2011-10-27 ENCOUNTER — Ambulatory Visit (AMBULATORY_SURGERY_CENTER): Payer: 59 | Admitting: *Deleted

## 2011-10-27 DIAGNOSIS — Z1211 Encounter for screening for malignant neoplasm of colon: Secondary | ICD-10-CM

## 2011-10-27 NOTE — Progress Notes (Signed)
Upon further review of the chart, pt only has a family hx of colon polyps, not cancer.  She has not had any polyps removed and is not having any GI problems.  Dr. Jarold Motto showed the chart and verified that pt doesn't need colonoscopy for another 5 years.  Pt informed and recall put into computer for 5 more years

## 2011-10-29 NOTE — Progress Notes (Signed)
Addended by: Maple Hudson on: 10/29/2011 08:10 AM   Modules accepted: Level of Service

## 2011-10-30 ENCOUNTER — Other Ambulatory Visit: Payer: 59 | Admitting: Gastroenterology

## 2012-01-12 ENCOUNTER — Telehealth: Payer: Self-pay | Admitting: *Deleted

## 2012-01-12 MED ORDER — ALENDRONATE SODIUM 70 MG PO TABS: 70.0000 mg | ORAL_TABLET | ORAL | Status: DC

## 2012-01-12 NOTE — Telephone Encounter (Signed)
Pt indicated that she has not tried any of the preferred drugs: fosamax, fosamax plus d or Boniva.  Pt notes that the only med tried was Actonel which is now a non-preferred drug.

## 2012-01-12 NOTE — Telephone Encounter (Signed)
Discuss with patient, Rx sent. 

## 2012-01-12 NOTE — Telephone Encounter (Signed)
Please switch to Fosamax 70mg  weekly, #4, 6 refills

## 2012-04-07 ENCOUNTER — Other Ambulatory Visit: Payer: Self-pay | Admitting: Family Medicine

## 2012-04-07 MED ORDER — LEVOTHYROXINE SODIUM 75 MCG PO TABS: 75.0000 ug | ORAL_TABLET | Freq: Every day | ORAL | Status: DC

## 2012-04-07 NOTE — Telephone Encounter (Signed)
rx sent to pharmacy by e-script for #30 with 1 refill per pt has upcoming apt next month

## 2012-04-07 NOTE — Telephone Encounter (Signed)
Refill for Synthroid Tablet Qty 30 Take 1-tablet every day  Last filled 9.13.2012  Last OV 9.21.2012 CPE appointment made for 5.13.13

## 2012-05-02 ENCOUNTER — Ambulatory Visit (INDEPENDENT_AMBULATORY_CARE_PROVIDER_SITE_OTHER): Payer: BC Managed Care – PPO | Admitting: Family Medicine

## 2012-05-02 VITALS — BP 112/78 | HR 67 | Temp 98.1°F | Ht 63.0 in | Wt 107.4 lb

## 2012-05-02 DIAGNOSIS — E039 Hypothyroidism, unspecified: Secondary | ICD-10-CM

## 2012-05-02 DIAGNOSIS — Z0000 Encounter for general adult medical examination without abnormal findings: Secondary | ICD-10-CM | POA: Insufficient documentation

## 2012-05-02 DIAGNOSIS — M810 Age-related osteoporosis without current pathological fracture: Secondary | ICD-10-CM

## 2012-05-02 MED ORDER — RISEDRONATE SODIUM 35 MG PO TBEC: 1.0000 | DELAYED_RELEASE_TABLET | ORAL | Status: DC

## 2012-05-02 NOTE — Progress Notes (Signed)
  Subjective:    Patient ID: Kerri Garrett, female    DOB: 1951-02-18, 61 y.o.   MRN: [id number]  HPI CPE- UTD on pap, mammo, colonoscopy.  Osteoporosis- was previously on Atelvia which was tolerated w/out difficulty.  Due to insurance change, was switched to Fosamax which is causing GI upset and pt prefers to resume Atelvia.  Has also been on Actonel previously.   Review of Systems Patient reports no hearing changes, adenopathy,fever, weight change,  persistant/recurrent hoarseness , swallowing issues, chest pain, palpitations, edema, persistant/recurrent cough, hemoptysis, dyspnea (rest/exertional/paroxysmal nocturnal), gastrointestinal bleeding (melena, rectal bleeding), abdominal pain, significant heartburn, bowel changes, GU symptoms (dysuria, hematuria, incontinence), Gyn symptoms (abnormal  bleeding, pain),  syncope, focal weakness, memory loss, numbness & tingling, skin/hair/nail changes, abnormal bruising or bleeding, anxiety, or depression.   + double vision- has seen Dr Dione Booze and neuro    Objective:   Physical Exam General Appearance:    Alert, cooperative, no distress, appears stated age  Head:    Normocephalic, without obvious abnormality, atraumatic  Eyes:    PERRL, conjunctiva/corneas clear, EOM's intact, fundi    benign, both eyes  Ears:    Normal TM's and external ear canals, both ears  Nose:   Nares normal, septum midline, mucosa normal, no drainage    or sinus tenderness  Throat:   Lips, mucosa, and tongue normal; teeth and gums normal  Neck:   Supple, symmetrical, trachea midline, no adenopathy;    Thyroid: no enlargement/tenderness/nodules  Back:     Symmetric, no curvature, ROM normal, no CVA tenderness  Lungs:     Clear to auscultation bilaterally, respirations unlabored  Chest Wall:    No tenderness or deformity   Heart:    Regular rate and rhythm, S1 and S2 normal, no murmur, rub   or gallop  Breast Exam:    Deferred to GYN  Abdomen:     Soft, non-tender,  bowel sounds active all four quadrants,    no masses, no organomegaly  Genitalia:    Deferred to GYN  Rectal:    Extremities:   Extremities normal, atraumatic, no cyanosis or edema  Pulses:   2+ and symmetric all extremities  Skin:   Skin color, texture, turgor normal, no rashes or lesions  Lymph nodes:   Cervical, supraclavicular, and axillary nodes normal  Neurologic:   CNII-XII intact, normal strength, sensation and reflexes    throughout          Assessment & Plan:

## 2012-05-02 NOTE — Patient Instructions (Signed)
Follow up in 1 year or as needed We'll notify you of your lab results Call with any questions or concerns If the mood worsens- call me! Call with any questions or concerns Hang in there!!!

## 2012-05-03 LAB — CBC WITH DIFFERENTIAL/PLATELET
Basophils Absolute: 0 10*3/uL (ref 0.0–0.1)
Basophils Relative: 0.6 % (ref 0.0–3.0)
Eosinophils Absolute: 0.1 10*3/uL (ref 0.0–0.7)
Lymphocytes Relative: 46.8 % — ABNORMAL HIGH (ref 12.0–46.0)
MCHC: 33.4 g/dL (ref 30.0–36.0)
Neutrophils Relative %: 40.3 % — ABNORMAL LOW (ref 43.0–77.0)
RBC: 4.28 Mil/uL (ref 3.87–5.11)
RDW: 13 % (ref 11.5–14.6)

## 2012-05-03 LAB — LIPID PANEL
Total CHOL/HDL Ratio: 2
VLDL: 10 mg/dL (ref 0.0–40.0)

## 2012-05-03 LAB — HEPATIC FUNCTION PANEL
ALT: 14 U/L (ref 0–35)
AST: 21 U/L (ref 0–37)
Alkaline Phosphatase: 52 U/L (ref 39–117)
Bilirubin, Direct: 0 mg/dL (ref 0.0–0.3)
Total Bilirubin: 0.4 mg/dL (ref 0.3–1.2)

## 2012-05-03 LAB — BASIC METABOLIC PANEL
Chloride: 104 mEq/L (ref 96–112)
Potassium: 4.7 mEq/L (ref 3.5–5.1)
Sodium: 140 mEq/L (ref 135–145)

## 2012-05-04 ENCOUNTER — Encounter: Payer: Self-pay | Admitting: *Deleted

## 2012-05-05 ENCOUNTER — Encounter: Payer: Self-pay | Admitting: *Deleted

## 2012-05-05 LAB — VITAMIN D 1,25 DIHYDROXY
Vitamin D2 1, 25 (OH)2: <8 pg/mL
Vitamin D3 1, 25 (OH)2: 48 pg/mL

## 2012-05-07 NOTE — Assessment & Plan Note (Signed)
Check labs.  Adjust meds prn  

## 2012-05-07 NOTE — Assessment & Plan Note (Signed)
Switch back to Atelvia due to GI intolerance.

## 2012-05-07 NOTE — Assessment & Plan Note (Signed)
Pt's PE WNL.  UTD on pap/mammo/DEXA, colonoscopy.  Check labs.  Anticipatory guidance provided.

## 2012-06-06 ENCOUNTER — Other Ambulatory Visit: Payer: Self-pay | Admitting: Family Medicine

## 2012-06-06 NOTE — Telephone Encounter (Signed)
refill synthroid 75 mcg tablet #30 Take one tablet by mouth every day Last fill 5.18.13 Last ov 5.13.13

## 2012-06-07 MED ORDER — LEVOTHYROXINE SODIUM 75 MCG PO TABS: 75.0000 ug | ORAL_TABLET | Freq: Every day | ORAL | Status: DC

## 2012-06-07 NOTE — Telephone Encounter (Signed)
rx sent to pharmacy by e-script  

## 2012-06-24 ENCOUNTER — Encounter: Payer: Self-pay | Admitting: Internal Medicine

## 2012-06-24 ENCOUNTER — Ambulatory Visit (INDEPENDENT_AMBULATORY_CARE_PROVIDER_SITE_OTHER): Payer: BC Managed Care – PPO | Admitting: Internal Medicine

## 2012-06-24 VITALS — BP 110/74 | HR 79 | Temp 98.0°F | Wt 105.0 lb

## 2012-06-24 DIAGNOSIS — R109 Unspecified abdominal pain: Secondary | ICD-10-CM

## 2012-06-24 DIAGNOSIS — N390 Urinary tract infection, site not specified: Secondary | ICD-10-CM

## 2012-06-24 DIAGNOSIS — R1030 Lower abdominal pain, unspecified: Secondary | ICD-10-CM

## 2012-06-24 LAB — POCT URINALYSIS DIPSTICK
Ketones, UA: NEGATIVE
Protein, UA: 300
Spec Grav, UA: 1.025
Urobilinogen, UA: 0.2

## 2012-06-24 MED ORDER — SULFAMETHOXAZOLE-TRIMETHOPRIM 800-160 MG PO TABS: 1.0000 | ORAL_TABLET | Freq: Two times a day (BID) | ORAL | Status: AC

## 2012-06-24 NOTE — Patient Instructions (Addendum)
Take antibiotics as prescribed for 3 days Drink plenty of fluids Call if not improving in the next few days, call anytime if you get worse.

## 2012-06-24 NOTE — Assessment & Plan Note (Signed)
Symptoms and udip suggest a UTI noting that the last time she had UTI type of symptoms, urine culture was contaminated. Plan: Bactrim Drink lots of fluids, see instructions. If she starts to have recurrent UTIs, will need  to discuss with PCP

## 2012-06-24 NOTE — Progress Notes (Signed)
  Subjective:    Patient ID: Kerri Garrett, female    DOB: 24-Dec-1950, 61 y.o.   MRN: [id number]  HPI Acute visit 2, 3 days history of urinary frequency and mild discomfort in the suprapubic area.  Past Medical History  Diagnosis Date  . Thyroid disease     hypothyroid  . Osteoporosis    Past Surgical History  Procedure Date  . Tubal ligation     Review of Systems No fever or chills No nausea or vomiting No flank pains. No vaginal discharge     Objective:   Physical Exam General -- alert, well-developed, and well-nourished.   Abdomen--soft, non-tender, no distention, no masses, no HSM, no guarding, and no rigidity.  No CVA tenderness Extremities-- no pretibial edema bilaterally Psych-- Cognition and judgment appear intact. Alert and cooperative with normal attention span and concentration.  not anxious appearing and not depressed appearing.       Assessment & Plan:

## 2012-06-26 LAB — URINE CULTURE: Colony Count: >=100000

## 2012-08-03 ENCOUNTER — Other Ambulatory Visit: Payer: Self-pay | Admitting: Family Medicine

## 2012-08-03 DIAGNOSIS — Z1231 Encounter for screening mammogram for malignant neoplasm of breast: Secondary | ICD-10-CM

## 2012-09-05 ENCOUNTER — Ambulatory Visit
Admission: RE | Admit: 2012-09-05 | Discharge: 2012-09-05 | Disposition: A | Discharge status: Home / Self Care | Payer: BC Managed Care – PPO | Source: Ambulatory Visit | Attending: Family Medicine | Admitting: Family Medicine

## 2012-09-05 DIAGNOSIS — Z1231 Encounter for screening mammogram for malignant neoplasm of breast: Secondary | ICD-10-CM

## 2012-12-20 ENCOUNTER — Other Ambulatory Visit: Payer: Self-pay | Admitting: *Deleted

## 2012-12-20 ENCOUNTER — Telehealth: Payer: Self-pay

## 2012-12-20 ENCOUNTER — Telehealth: Payer: Self-pay | Admitting: Family Medicine

## 2012-12-20 DIAGNOSIS — E039 Hypothyroidism, unspecified: Secondary | ICD-10-CM

## 2012-12-20 MED ORDER — LEVOTHYROXINE SODIUM 75 MCG PO TABS: 75.0000 ug | ORAL_TABLET | Freq: Every day | ORAL | Status: DC

## 2012-12-20 NOTE — Telephone Encounter (Signed)
Refill: Synthroid 75 mcg tablet. Take 1 tablet by mouth daily. Qty 30. Last fill 11-16-12

## 2012-12-20 NOTE — Telephone Encounter (Signed)
Patient has been made aware and will call first thing Thursday morning if not feeling any better.      KP

## 2012-12-20 NOTE — Telephone Encounter (Signed)
Its ok to take mucinex or mucinex DM

## 2012-12-20 NOTE — Telephone Encounter (Signed)
Refill for synthroid sent to pharmacy

## 2012-12-20 NOTE — Telephone Encounter (Signed)
Call from the patient and she is currently taking Synthroid and wants to know if it ok to take Mucinex cold and sinus. She stated on the bottle it says to contact the provider if you have thyroid disease. Please advise      KP

## 2013-04-04 ENCOUNTER — Telehealth: Payer: Self-pay | Admitting: Family Medicine

## 2013-04-04 ENCOUNTER — Encounter: Payer: Self-pay | Admitting: Family Medicine

## 2013-04-04 ENCOUNTER — Ambulatory Visit (INDEPENDENT_AMBULATORY_CARE_PROVIDER_SITE_OTHER): Payer: BC Managed Care – PPO | Admitting: Family Medicine

## 2013-04-04 VITALS — BP 100/64 | HR 77 | Temp 99.0°F | Wt 111.4 lb

## 2013-04-04 DIAGNOSIS — J209 Acute bronchitis, unspecified: Secondary | ICD-10-CM

## 2013-04-04 MED ORDER — GUAIFENESIN-CODEINE 100-10 MG/5ML PO SYRP: ORAL_SOLUTION | ORAL | Status: DC

## 2013-04-04 MED ORDER — AZITHROMYCIN 250 MG PO TABS: ORAL_TABLET | ORAL | Status: DC

## 2013-04-04 NOTE — Progress Notes (Signed)
  Subjective:     Kerri Garrett is a 62 y.o. female here for evaluation of a cough. Onset of symptoms was 7 days ago. Symptoms have been gradually worsening since that time. The cough is productive and is aggravated by reclining position. Associated symptoms include: postnasal drip and sputum production. Patient does not have a history of asthma. Patient does not have a history of environmental allergens. Patient has not traveled recently. Patient does not have a history of smoking. Patient has not had a previous chest x-ray. Patient has not had a PPD done.  The following portions of the patient's history were reviewed and updated as appropriate: allergies, current medications, past family history, past medical history, past social history, past surgical history and problem list.  Review of Systems Pertinent items are noted in HPI.    Objective:    Oxygen saturation 99% on room air BP 100/64  Pulse 77  Temp(Src) 99 F (37.2 C) (Oral)  Wt 111 lb 6.4 oz (50.531 kg)  BMI 19.74 kg/m2  SpO2 99% General appearance: alert, cooperative, appears stated age and no distress Ears: normal TM's and external ear canals both ears Nose: Nares normal. Septum midline. Mucosa normal. No drainage or sinus tenderness. Throat: lips, mucosa, and tongue normal; teeth and gums normal Neck: mild anterior cervical adenopathy, supple, symmetrical, trachea midline and thyroid not enlarged, symmetric, no tenderness/mass/nodules Lungs: diminished breath sounds bilaterally    Assessment:    Acute Bronchitis    Plan:    Antibiotics per medication orders. Antitussives per medication orders. Call if shortness of breath worsens, blood in sputum, change in character of cough, development of fever or chills, inability to maintain nutrition and hydration. Avoid exposure to tobacco smoke and fumes. Trial of antihistamines.

## 2013-04-04 NOTE — Telephone Encounter (Signed)
Appointment Scheduled:  04/04/2013 14:45:00  Appointment Scheduled Provider:  Lelon Perla

## 2013-04-04 NOTE — Patient Instructions (Signed)

## 2013-04-04 NOTE — Telephone Encounter (Signed)
Patient Information:  Caller Name: [name]  Phone: [phone]  Patient: Kerri Garrett, Kerri Garrett  Gender: Female  DOB: Dec 15, 1951  Age: 62 Years  PCP: Sheliah Hatch.  Office Follow Up:  Does the office need to follow up with this patient?: No  Instructions For The Office: N/A   Symptoms  Reason For Call & Symptoms: Patient calling, has nasal congestion and chest congestion.  No fever.  Has a productive cough, ugly gray in color.  Has had sx for a week now.  Her voice is crackling at times.  Reviewed Health History In EMR: Yes  Reviewed Medications In EMR: Yes  Reviewed Allergies In EMR: Yes  Reviewed Surgeries / Procedures: Yes  Date of Onset of Symptoms: 03/28/2013  Treatments Tried: Advil Cold; Allegra OTC  Treatments Tried Worked: No  Guideline(s) Used:  Cough  Disposition Per Guideline:   See Today or Tomorrow in Office  Reason For Disposition Reached:   Patient wants to be seen  Advice Given:  N/A  Patient Will Follow Care Advice:  YES  Appointment Scheduled:  04/04/2013 14:45:00 Appointment Scheduled Provider:  Lelon Perla.

## 2013-05-04 ENCOUNTER — Encounter: Payer: Self-pay | Admitting: Family Medicine

## 2013-05-04 ENCOUNTER — Ambulatory Visit (INDEPENDENT_AMBULATORY_CARE_PROVIDER_SITE_OTHER): Payer: BC Managed Care – PPO | Admitting: Family Medicine

## 2013-05-04 VITALS — BP 104/60 | HR 74 | Temp 98.3°F | Ht 61.75 in | Wt 111.2 lb

## 2013-05-04 DIAGNOSIS — Z1231 Encounter for screening mammogram for malignant neoplasm of breast: Secondary | ICD-10-CM

## 2013-05-04 DIAGNOSIS — M810 Age-related osteoporosis without current pathological fracture: Secondary | ICD-10-CM

## 2013-05-04 DIAGNOSIS — Z0000 Encounter for general adult medical examination without abnormal findings: Secondary | ICD-10-CM

## 2013-05-04 DIAGNOSIS — N390 Urinary tract infection, site not specified: Secondary | ICD-10-CM

## 2013-05-04 LAB — CBC WITH DIFFERENTIAL/PLATELET
Basophils Relative: 0.6 % (ref 0.0–3.0)
Eosinophils Absolute: 0.1 10*3/uL (ref 0.0–0.7)
Eosinophils Relative: 1.7 % (ref 0.0–5.0)
Hemoglobin: 13.1 g/dL (ref 12.0–15.0)
Lymphocytes Relative: 36.3 % (ref 12.0–46.0)
MCHC: 33.9 g/dL (ref 30.0–36.0)
MCV: 88.3 fl (ref 78.0–100.0)
Neutro Abs: 2.5 10*3/uL (ref 1.4–7.7)
RBC: 4.38 Mil/uL (ref 3.87–5.11)
WBC: 4.8 10*3/uL (ref 4.5–10.5)

## 2013-05-04 LAB — HEPATIC FUNCTION PANEL
ALT: 15 U/L (ref 0–35)
AST: 21 U/L (ref 0–37)
Albumin: 4.1 g/dL (ref 3.5–5.2)
Total Bilirubin: 0.6 mg/dL (ref 0.3–1.2)
Total Protein: 7.3 g/dL (ref 6.0–8.3)

## 2013-05-04 LAB — BASIC METABOLIC PANEL
BUN: 15 mg/dL (ref 6–23)
CO2: 29 mEq/L (ref 19–32)
Chloride: 105 mEq/L (ref 96–112)
Creatinine, Ser: 0.9 mg/dL (ref 0.4–1.2)
Glucose, Bld: 85 mg/dL (ref 70–99)
Potassium: 4.2 mEq/L (ref 3.5–5.1)

## 2013-05-04 LAB — TSH: TSH: 0.21 u[IU]/mL — ABNORMAL LOW (ref 0.35–5.50)

## 2013-05-04 LAB — LIPID PANEL: Cholesterol: 254 mg/dL — ABNORMAL HIGH (ref 0–200)

## 2013-05-04 NOTE — Patient Instructions (Addendum)
Follow up in 1 year or as needed Keep up the good work!  You look great! We'll notify you of your lab results Continue the Vit D- add the Calcium daily A once daily multivitamin is the only other thing you need Call with any questions or concerns Happy Memorial Day!

## 2013-05-04 NOTE — Progress Notes (Signed)
  Subjective:    Patient ID: Kerri Garrett, female    DOB: 1951-07-17, 62 y.o.   MRN: [id number]  HPI CPE- UTD on colonoscopy, mammo, pap.  Due for DEXA in Oct.   Review of Systems Patient reports no vision/ hearing changes, adenopathy,fever, weight change,  persistant/recurrent hoarseness , swallowing issues, chest pain, palpitations, edema, persistant/recurrent cough, hemoptysis, dyspnea (rest/exertional/paroxysmal nocturnal), gastrointestinal bleeding (melena, rectal bleeding), abdominal pain, significant heartburn, bowel changes, GU symptoms (dysuria, hematuria, incontinence), Gyn symptoms (abnormal  bleeding, pain),  syncope, focal weakness, memory loss, numbness & tingling, skin/hair/nail changes, abnormal bruising or bleeding, anxiety, or depression.     Objective:   Physical Exam General Appearance:    Alert, cooperative, no distress, appears stated age  Head:    Normocephalic, without obvious abnormality, atraumatic  Eyes:    PERRL, conjunctiva/corneas clear, EOM's intact, fundi    benign, both eyes  Ears:    Normal TM's and external ear canals, both ears  Nose:   Nares normal, septum midline, mucosa normal, no drainage    or sinus tenderness  Throat:   Lips, mucosa, and tongue normal; teeth and gums normal  Neck:   Supple, symmetrical, trachea midline, no adenopathy;    Thyroid: no enlargement/tenderness/nodules  Back:     Symmetric, no curvature, ROM normal, no CVA tenderness  Lungs:     Clear to auscultation bilaterally, respirations unlabored  Chest Wall:    No tenderness or deformity   Heart:    Regular rate and rhythm, S1 and S2 normal, no murmur, rub   or gallop  Breast Exam:    Deferred to mammo  Abdomen:     Soft, non-tender, bowel sounds active all four quadrants,    no masses, no organomegaly  Genitalia:    Deferred   Rectal:    Extremities:   Extremities normal, atraumatic, no cyanosis or edema  Pulses:   2+ and symmetric all extremities  Skin:   Skin color,  texture, turgor normal, no rashes or lesions  Lymph nodes:   Cervical, supraclavicular, and axillary nodes normal  Neurologic:   CNII-XII intact, normal strength, sensation and reflexes    throughout          Assessment & Plan:

## 2013-05-04 NOTE — Assessment & Plan Note (Signed)
Pt's PE WNL.  UTD on health maintenance- mammo and DEXA due in Oct.  Orders entered.  Check labs.  Anticipatory guidance provided.

## 2013-05-05 ENCOUNTER — Other Ambulatory Visit: Payer: Self-pay | Admitting: General Practice

## 2013-05-05 LAB — POCT URINALYSIS DIPSTICK
Bilirubin, UA: NEGATIVE
Glucose, UA: NEGATIVE
Nitrite, UA: NEGATIVE
Urobilinogen, UA: 0.2

## 2013-05-05 MED ORDER — LEVOTHYROXINE SODIUM 50 MCG PO TABS: 50.0000 ug | ORAL_TABLET | Freq: Every day | ORAL | Status: DC

## 2013-05-05 NOTE — Addendum Note (Signed)
Addended by: Silvio Pate D on: 05/05/2013 03:40 PM   Modules accepted: Orders

## 2013-05-08 ENCOUNTER — Telehealth: Payer: Self-pay | Admitting: *Deleted

## 2013-05-08 LAB — VITAMIN D 1,25 DIHYDROXY
Vitamin D2 1, 25 (OH)2: <8 pg/mL
Vitamin D3 1, 25 (OH)2: 44 pg/mL

## 2013-05-08 LAB — URINE CULTURE: Colony Count: >=100000

## 2013-05-08 MED ORDER — SULFAMETHOXAZOLE-TRIMETHOPRIM 800-160 MG PO TABS: 1.0000 | ORAL_TABLET | Freq: Two times a day (BID) | ORAL | Status: DC

## 2013-05-08 NOTE — Telephone Encounter (Signed)
Spoke with the pt and informed her of recent UC results and note.   Pt understood and agreed.   New rx sent to the pharmacy by e-script.//AB/CMA

## 2013-05-08 NOTE — Telephone Encounter (Signed)
Message copied by [name] on Mon May 08, 2013  4:52 PM ------      Message from: Sheliah Hatch      Created: Mon May 08, 2013  9:06 AM       Pt has UTI- needs to start Bactrim DS twice daily x7 days.  Should avoid lying out in the sun as this will make her sun sensitive ------

## 2013-07-24 ENCOUNTER — Ambulatory Visit (INDEPENDENT_AMBULATORY_CARE_PROVIDER_SITE_OTHER): Payer: BC Managed Care – PPO | Admitting: Family Medicine

## 2013-07-24 ENCOUNTER — Telehealth: Payer: Self-pay | Admitting: Family Medicine

## 2013-07-24 ENCOUNTER — Encounter: Payer: Self-pay | Admitting: Family Medicine

## 2013-07-24 VITALS — BP 114/69 | HR 75 | Temp 99.6°F | Resp 16 | Ht 62.5 in | Wt 113.0 lb

## 2013-07-24 DIAGNOSIS — N390 Urinary tract infection, site not specified: Secondary | ICD-10-CM

## 2013-07-24 LAB — POCT URINALYSIS DIPSTICK
Bilirubin, UA: NEGATIVE
Glucose, UA: NEGATIVE
Nitrite, UA: NEGATIVE
Spec Grav, UA: 1.025
Urobilinogen, UA: 0.2

## 2013-07-24 MED ORDER — SULFAMETHOXAZOLE-TRIMETHOPRIM 800-160 MG PO TABS: 1.0000 | ORAL_TABLET | Freq: Two times a day (BID) | ORAL | Status: DC

## 2013-07-24 NOTE — Progress Notes (Addendum)
OFFICE NOTE  07/24/2013  CC:  Chief Complaint  Patient presents with  . Dysuria    started Thursday evening.   . Abdominal Pain    low abd right above pelvic area  . Back Pain     HPI: Patient is a 62 y.o. Caucasian female patient of Dr. Rennis Golden who is here for dysuria. Onset about 5d/a.  Dysuria, urgency, frequency, suprapubic discomfort, slight low back region hurting in bilat paraspinous muscle regions.  No fevers/chills.  Felt nauseated yesterday but no vomiting. No OTC meds taken for sx's.  Trying lots of fluids.  Pertinent PMH:  Past Medical History  Diagnosis Date  . Thyroid disease     hypothyroid  . Osteoporosis   Last UTI was 04/2013--responded well to bactrim and pt tolerated this well.  MEDS:  Outpatient Prescriptions Prior to Visit  Medication Sig Dispense Refill  . calcium gluconate 500 MG tablet Take 500 mg by mouth daily.        . Cholecalciferol (VITAMIN D) 2000 UNITS CAPS Take by mouth daily.        Marland Kitchen levothyroxine (SYNTHROID, LEVOTHROID) 50 MCG tablet Take 1 tablet (50 mcg total) by mouth daily.  30 tablet  6  . azithromycin (ZITHROMAX Z-PAK) 250 MG tablet As above  6 each  0  . guaiFENesin-codeine (ROBITUSSIN AC) 100-10 MG/5ML syrup 1-2 tsp po qhs prn cough  120 mL  0  . KRILL OIL 1000 MG CAPS Take by mouth.      . Risedronate Sodium (ATELVIA) 35 MG TBEC Take 1 tablet (35 mg total) by mouth once a week.  4 tablet  11  . sulfamethoxazole-trimethoprim (BACTRIM DS) 800-160 MG per tablet Take 1 tablet by mouth 2 (two) times daily. Taking x 7 days  14 tablet  0   No facility-administered medications prior to visit.  **Not currently taking bactrim listed above.  PE: Blood pressure 114/69, pulse 75, temperature 99.6 F (37.6 C), temperature source Temporal, resp. rate 16, height 5' 2.5" (1.588 m), weight 113 lb (51.256 kg), SpO2 100.00%. Gen: Alert, well appearing.  Patient is oriented to person, place, time, and situation. CV: RRR, no m/r/g Back: no CVA or  low back tenderness.  LAB: CC UA today showed moderate blood and large LEU.  IMPRESSION AND PLAN:  UTI (urinary tract infection) Sent urine for c/s. Bactrim DS 1 bid x 3d---but 5d of meds rx'd just in case she has any mild residual sx's still left at the 3d mark.  If all sx's resolved after 3d of abx then stop abx.   An After Visit Summary was printed and given to the patient.  FOLLOW UP: prn

## 2013-07-24 NOTE — Assessment & Plan Note (Signed)
Sent urine for c/s. Bactrim DS 1 bid x 3d---but 5d of meds rx'd just in case she has any mild residual sx's still left at the 3d mark.  If all sx's resolved after 3d of abx then stop abx.

## 2013-07-24 NOTE — Telephone Encounter (Signed)
For MD to review and advise      KP

## 2013-07-24 NOTE — Telephone Encounter (Signed)
Patient Information:  Caller Name: [name]  Phone: [phone]  Patient: Kerri Garrett, Kerri Garrett  Gender: Female  DOB: [birth year]  Age: 62 Years  PCP: Sheliah Hatch.  Office Follow Up:  Does the office need to follow up with this patient?: No  Instructions For The Office: N/A  RN Note:  Mild low back pain present. Suggested OTC urinary analgesic like Azo-Standard.  No appointments left in [place] office for 07/24/13.  Scheduled  at 15:15 07/24/13 with Dr Milinda Cave at [facility].    Symptoms  Reason For Call & Symptoms: Dysuria with suprapubic cramping, incompleted emptying or urgency.  Reviewed Health History In EMR: Yes  Reviewed Medications In EMR: Yes  Reviewed Allergies In EMR: Yes  Reviewed Surgeries / Procedures: Yes  Date of Onset of Symptoms: 07/20/2013  Treatments Tried: > water intake  Treatments Tried Worked: No  Guideline(s) Used:  Urination Pain - Female  Disposition Per Guideline:   Go to Office Now  Reason For Disposition Reached:   Side (flank) or lower back pain present  Advice Given:  Fluids:   Drink extra fluids. Drink 8-10 glasses of liquids a day (Reason: to produce a dilute, non-irritating urine).  Warm Saline SITZ Baths to Reduce Pain:  Sit in a warm saline bath for 20 minutes to cleanse the area and to reduce pain. Add 2 oz. of table salt or baking soda to a tub of water.  Call Back If:  You become worse.  Patient Will Follow Care Advice:  YES

## 2013-07-26 LAB — URINE CULTURE: Colony Count: >=100000

## 2013-08-17 ENCOUNTER — Encounter: Payer: Self-pay | Admitting: Family Medicine

## 2013-08-17 ENCOUNTER — Ambulatory Visit (INDEPENDENT_AMBULATORY_CARE_PROVIDER_SITE_OTHER): Payer: BC Managed Care – PPO | Admitting: Family Medicine

## 2013-08-17 VITALS — BP 118/72 | HR 71 | Temp 98.9°F | Ht 62.5 in | Wt 113.4 lb

## 2013-08-17 DIAGNOSIS — M62838 Other muscle spasm: Secondary | ICD-10-CM | POA: Insufficient documentation

## 2013-08-17 MED ORDER — CYCLOBENZAPRINE HCL 10 MG PO TABS: 10.0000 mg | ORAL_TABLET | Freq: Three times a day (TID) | ORAL | Status: DC | PRN

## 2013-08-17 MED ORDER — NAPROXEN 500 MG PO TABS: 500.0000 mg | ORAL_TABLET | Freq: Two times a day (BID) | ORAL | Status: DC
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
## 2013-08-21 NOTE — Assessment & Plan Note (Signed)
New.  Start scheduled NSAIDs, muscle relaxers PRN.  Heat.  Reviewed supportive care and red flags that should prompt return.  Pt expressed understanding and is in agreement w/ plan.

## 2013-09-08 ENCOUNTER — Ambulatory Visit
Admission: RE | Admit: 2013-09-08 | Discharge: 2013-09-08 | Disposition: A | Discharge status: Home / Self Care | Payer: BC Managed Care – PPO | Source: Ambulatory Visit | Attending: Family Medicine | Admitting: Family Medicine

## 2013-09-08 DIAGNOSIS — M810 Age-related osteoporosis without current pathological fracture: Secondary | ICD-10-CM

## 2013-09-08 DIAGNOSIS — Z1231 Encounter for screening mammogram for malignant neoplasm of breast: Secondary | ICD-10-CM

## 2013-09-13 ENCOUNTER — Encounter: Payer: Self-pay | Admitting: *Deleted

## 2013-09-18 ENCOUNTER — Telehealth: Payer: Self-pay | Admitting: Family Medicine

## 2013-09-18 DIAGNOSIS — E039 Hypothyroidism, unspecified: Secondary | ICD-10-CM

## 2013-09-18 NOTE — Telephone Encounter (Signed)
Pt states that she is experiencing hair loss. Wants to know if this could have anything to do with her thyroid medication. Please advise.

## 2013-09-19 NOTE — Telephone Encounter (Signed)
Pt needs appt to recheck thyroid level as she had her meds adjusted in May.  May again need meds adjusted.

## 2013-09-19 NOTE — Telephone Encounter (Signed)
Spoke with pt appt made for Friday to recheck TSH.

## 2013-09-22 ENCOUNTER — Other Ambulatory Visit (INDEPENDENT_AMBULATORY_CARE_PROVIDER_SITE_OTHER): Payer: BC Managed Care – PPO

## 2013-09-22 DIAGNOSIS — E039 Hypothyroidism, unspecified: Secondary | ICD-10-CM

## 2013-09-22 LAB — TSH: TSH: 3.38 u[IU]/mL (ref 0.35–5.50)

## 2013-09-25 ENCOUNTER — Encounter: Payer: Self-pay | Admitting: General Practice

## 2013-09-27 ENCOUNTER — Telehealth: Payer: Self-pay | Admitting: General Practice

## 2013-09-27 NOTE — Telephone Encounter (Signed)
Called pt about recent bone density results.   Per [place]vere[place] Low: (osteoporosis) Needs to stay on Risedronate Sodium (ATELVIA) 35 MG TBEC and Calcium 1200mg  and Vitamin 600mg . No changes.

## 2013-10-02 NOTE — Telephone Encounter (Signed)
Called pt again. No answer. Could not leave a message.

## 2013-10-03 ENCOUNTER — Ambulatory Visit (INDEPENDENT_AMBULATORY_CARE_PROVIDER_SITE_OTHER): Payer: BC Managed Care – PPO | Admitting: Family Medicine

## 2013-10-03 ENCOUNTER — Encounter: Payer: Self-pay | Admitting: Family Medicine

## 2013-10-03 VITALS — BP 118/78 | HR 96 | Temp 98.3°F | Wt 117.0 lb

## 2013-10-03 DIAGNOSIS — R300 Dysuria: Secondary | ICD-10-CM

## 2013-10-03 MED ORDER — CIPROFLOXACIN HCL 500 MG PO TABS: 500.0000 mg | ORAL_TABLET | Freq: Two times a day (BID) | ORAL | Status: AC

## 2013-10-03 NOTE — Telephone Encounter (Signed)
Pt notified of results

## 2013-10-03 NOTE — Patient Instructions (Signed)
Urinary Tract Infection  Urinary tract infections (UTIs) can develop anywhere along your urinary tract. Your urinary tract is your body's drainage system for removing wastes and extra water. Your urinary tract includes two kidneys, two ureters, a bladder, and a urethra. Your kidneys are a pair of bean-shaped organs. Each kidney is about the size of your fist. They are located below your ribs, one on each side of your spine.  CAUSES  Infections are caused by microbes, which are microscopic organisms, including fungi, viruses, and bacteria. These organisms are so small that they can only be seen through a microscope. Bacteria are the microbes that most commonly cause UTIs.  SYMPTOMS   Symptoms of UTIs may vary by age and gender of the patient and by the location of the infection. Symptoms in young women typically include a frequent and intense urge to urinate and a painful, burning feeling in the bladder or urethra during urination. Older women and men are more likely to be tired, shaky, and weak and have muscle aches and abdominal pain. A fever may mean the infection is in your kidneys. Other symptoms of a kidney infection include pain in your back or sides below the ribs, nausea, and vomiting.  DIAGNOSIS  To diagnose a UTI, your caregiver will ask you about your symptoms. Your caregiver also will ask to provide a urine sample. The urine sample will be tested for bacteria and white blood cells. White blood cells are made by your body to help fight infection.  TREATMENT   Typically, UTIs can be treated with medication. Because most UTIs are caused by a bacterial infection, they usually can be treated with the use of antibiotics. The choice of antibiotic and length of treatment depend on your symptoms and the type of bacteria causing your infection.  HOME CARE INSTRUCTIONS   If you were prescribed antibiotics, take them exactly as your caregiver instructs you. Finish the medication even if you feel better after you  have only taken some of the medication.   Drink enough water and fluids to keep your urine clear or pale yellow.   Avoid caffeine, tea, and carbonated beverages. They tend to irritate your bladder.   Empty your bladder often. Avoid holding urine for long periods of time.   Empty your bladder before and after sexual intercourse.   After a bowel movement, women should cleanse from front to back. Use each tissue only once.  SEEK MEDICAL CARE IF:    You have back pain.   You develop a fever.   Your symptoms do not begin to resolve within 3 days.  SEEK IMMEDIATE MEDICAL CARE IF:    You have severe back pain or lower abdominal pain.   You develop chills.   You have nausea or vomiting.   You have continued burning or discomfort with urination.  MAKE SURE YOU:    Understand these instructions.   Will watch your condition.   Will get help right away if you are not doing well or get worse.  Document Released: 09/16/2005 Document Revised: 06/07/2012 Document Reviewed: 01/15/2012  Exit[employer] Patient Information 2014 [employer]

## 2013-10-03 NOTE — Addendum Note (Signed)
Addended by: Silvio Pate D on: 10/03/2013 03:44 PM   Modules accepted: Orders

## 2013-10-03 NOTE — Progress Notes (Signed)
  Subjective:    Kerri Garrett is a 62 y.o. female who complains of burning with urination, frequency, suprapubic pressure and urgency. She has had symptoms for 7 days. Patient also complains of back pain. Patient denies congestion, cough, fever, headache, rhinitis, sorethroat and vaginal discharge. Patient does have a history of recurrent UTI. Patient does not have a history of pyelonephritis.   The following portions of the patient's history were reviewed and updated as appropriate: allergies, current medications, past family history, past medical history, past social history, past surgical history and problem list.  Review of Systems Pertinent items are noted in HPI.    Objective:    BP 118/78  Pulse 96  Temp(Src) 98.3 F (36.8 C) (Oral)  Wt 117 lb (53.071 kg)  BMI 21.05 kg/m2  SpO2 96% General appearance: alert, cooperative, appears stated age and no distress Abdomen: soft, non-tender; bowel sounds normal; no masses,  no organomegaly  Laboratory:  Urine dipstick: large for hemoglobin.  ---pt took AZO Micro exam: not done.    Assessment:    dyuria     Plan:    Medications: ciprofloxacin. Maintain adequate hydration. Follow up if symptoms not improving, and as needed.

## 2013-10-05 LAB — URINE CULTURE: Organism ID, Bacteria: NO GROWTH

## 2013-10-30 ENCOUNTER — Encounter: Payer: Self-pay | Admitting: Family Medicine

## 2013-12-20 ENCOUNTER — Encounter: Payer: Self-pay | Admitting: Internal Medicine

## 2013-12-20 ENCOUNTER — Other Ambulatory Visit (HOSPITAL_BASED_OUTPATIENT_CLINIC_OR_DEPARTMENT_OTHER): Payer: BC Managed Care – PPO

## 2013-12-20 ENCOUNTER — Ambulatory Visit (HOSPITAL_BASED_OUTPATIENT_CLINIC_OR_DEPARTMENT_OTHER)
Admission: RE | Admit: 2013-12-20 | Discharge: 2013-12-20 | Disposition: A | Discharge status: Home / Self Care | Payer: BC Managed Care – PPO | Source: Ambulatory Visit | Attending: Internal Medicine | Admitting: Internal Medicine

## 2013-12-20 ENCOUNTER — Ambulatory Visit (INDEPENDENT_AMBULATORY_CARE_PROVIDER_SITE_OTHER): Payer: BC Managed Care – PPO | Admitting: Internal Medicine

## 2013-12-20 VITALS — BP 126/77 | HR 72 | Temp 98.5°F | Wt 116.4 lb

## 2013-12-20 DIAGNOSIS — R635 Abnormal weight gain: Secondary | ICD-10-CM

## 2013-12-20 DIAGNOSIS — R110 Nausea: Secondary | ICD-10-CM

## 2013-12-20 DIAGNOSIS — R141 Gas pain: Secondary | ICD-10-CM

## 2013-12-20 DIAGNOSIS — R109 Unspecified abdominal pain: Secondary | ICD-10-CM

## 2013-12-20 DIAGNOSIS — R1030 Lower abdominal pain, unspecified: Secondary | ICD-10-CM

## 2013-12-20 DIAGNOSIS — R1031 Right lower quadrant pain: Secondary | ICD-10-CM | POA: Insufficient documentation

## 2013-12-20 DIAGNOSIS — Z780 Asymptomatic menopausal state: Secondary | ICD-10-CM | POA: Insufficient documentation

## 2013-12-20 DIAGNOSIS — R140 Abdominal distension (gaseous): Secondary | ICD-10-CM

## 2013-12-20 LAB — POCT URINALYSIS DIPSTICK
Bilirubin, UA: NEGATIVE
Ketones, UA: NEGATIVE
Leukocytes, UA: NEGATIVE
Nitrite, UA: NEGATIVE
Protein, UA: NEGATIVE
Urobilinogen, UA: 0.2
pH, UA: 6

## 2013-12-20 LAB — CBC WITH DIFFERENTIAL/PLATELET
Basophils Absolute: 0 10*3/uL (ref 0.0–0.1)
Basophils Relative: 1 % (ref 0–1)
Lymphocytes Relative: 43 % (ref 12–46)
MCHC: 33.3 g/dL (ref 30.0–36.0)
Neutro Abs: 3.2 10*3/uL (ref 1.7–7.7)
Neutrophils Relative %: 46 % (ref 43–77)
Platelets: 323 10*3/uL (ref 150–400)
RDW: 13 % (ref 11.5–15.5)
WBC: 6.9 10*3/uL (ref 4.0–10.5)

## 2013-12-20 MED ORDER — TRAMADOL HCL 50 MG PO TABS: 50.0000 mg | ORAL_TABLET | Freq: Four times a day (QID) | ORAL | Status: DC | PRN

## 2013-12-20 NOTE — Addendum Note (Signed)
Addended by: Amado Coe A on: 12/20/2013 01:18 PM   Modules accepted: Orders

## 2013-12-20 NOTE — Progress Notes (Signed)
Pre visit review using our clinic review tool, if applicable. No additional management support is needed unless otherwise documented below in the visit note. 
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
## 2013-12-20 NOTE — Addendum Note (Signed)
Addended by: Silvio Pate D on: 12/20/2013 01:16 PM   Modules accepted: Orders

## 2013-12-20 NOTE — Patient Instructions (Signed)
Stay on clear liquids for 48-72 hours or until US performed.This would include  jello, sherbert (NOT ice cream), Lipton[employer] chicken noodle soup(NOT cream based soups),Gatorade Lite, flat Ginger ale (without High Fructose Corn Syrup),dry toast or crackers, baked potato.No milk , dairy or grease To ER with medical record  For increasing pain, fever or rectal bleeding

## 2013-12-22 ENCOUNTER — Other Ambulatory Visit: Payer: Self-pay | Admitting: Family Medicine

## 2013-12-22 ENCOUNTER — Encounter: Payer: Self-pay | Admitting: General Practice

## 2013-12-22 ENCOUNTER — Ambulatory Visit: Payer: BC Managed Care – PPO | Admitting: Internal Medicine

## 2013-12-22 NOTE — Telephone Encounter (Signed)
Med filled.  

## 2014-03-08 ENCOUNTER — Telehealth: Payer: Self-pay | Admitting: *Deleted

## 2014-03-08 NOTE — Telephone Encounter (Signed)
Spoke with patient. She recently had dental surgery and have been taking ibuprofen and clindamycin.  Today, while having a bowel movement, patient noticed bright red blood on the tissue and "a good bit" in the toilet.  She has been having some constipation and had to strain some during the passage of stool.  Since then she has gone back to the bathroom one other time and did not notice any blood then.  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  She was also advised that if her issue persists or worsen to call back to make an appointment.  She stated understanding and agreed. No further questions or concerns were voiced.

## 2014-03-08 NOTE — Telephone Encounter (Signed)
Patient left message on triage line stating that she is passing blood, but did not state from where. She states that she had oral surgery about a week ago and has been taking clindamycin and ibuprofen. Please advise, she is currently at work.

## 2014-05-18 ENCOUNTER — Telehealth: Payer: Self-pay | Admitting: Family Medicine

## 2014-05-18 NOTE — Telephone Encounter (Signed)
Ok for nurse visit for TDAP for pt she has [employer]?

## 2014-05-18 NOTE — Telephone Encounter (Signed)
Ok for nurse visit for Tdap b/c we are probably not going to be able to accommodate a physical in that time frame

## 2014-05-18 NOTE — Telephone Encounter (Signed)
Left pt message to call back and schedule Tdap and a future CPE

## 2014-05-18 NOTE — Telephone Encounter (Signed)
Caller name: Lynnon  Call back number: [phone]   Reason for call:   Pt's daughter is having a baby at the end of July and pt is needing to come in for boosters or vaccines for whooping cough.  Pt would like to have her CPE before the end of July to make sure she is up to date on these.  Please advise how you would like this scheduled.

## 2014-05-18 NOTE — Telephone Encounter (Signed)
Pt scheduled  

## 2014-05-18 NOTE — Telephone Encounter (Signed)
Ok to schedule pt for nurse visit for TDAP, and a future CPE.

## 2014-05-22 ENCOUNTER — Ambulatory Visit (INDEPENDENT_AMBULATORY_CARE_PROVIDER_SITE_OTHER): Payer: BC Managed Care – PPO | Admitting: *Deleted

## 2014-05-22 DIAGNOSIS — Z23 Encounter for immunization: Secondary | ICD-10-CM

## 2014-06-07 IMAGING — US US TRANSVAGINAL NON-OB
1 series · 14 of 25 positions shown · non-contrast
Comparison: None

CLINICAL DATA: Right lower abdominal and pelvic pain.
Postmenopausal female.

EXAM:
TRANSABDOMINAL AND TRANSVAGINAL ULTRASOUND OF PELVIS
TECHNIQUE: Both transabdominal and transvaginal ultrasound examinations of the
pelvis were performed. Transabdominal technique was performed for
global imaging of the pelvis including uterus, ovaries, adnexal
regions, and pelvic cul-de-sac. It was necessary to proceed with
endovaginal exam following the transabdominal exam to visualize the
endometrium and adnexae.

[Series 1: us transvaginal non-ob · 0.30mm/px · 14 of 53 slices shown]
[im 1/53]
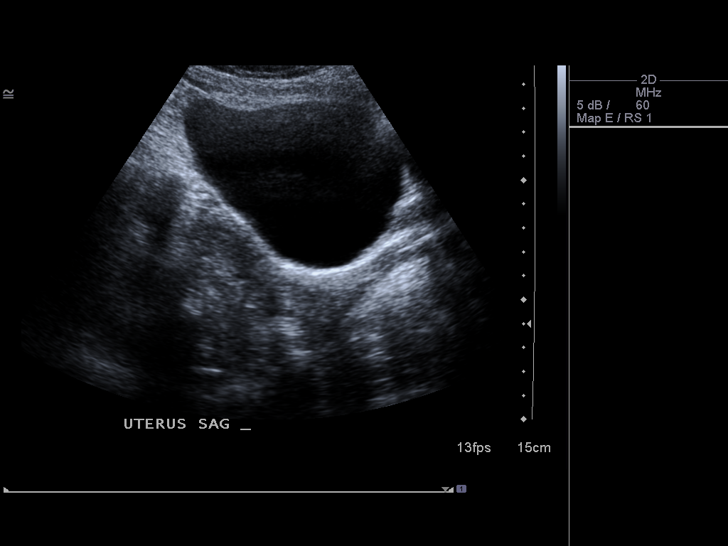
[im 5/53]
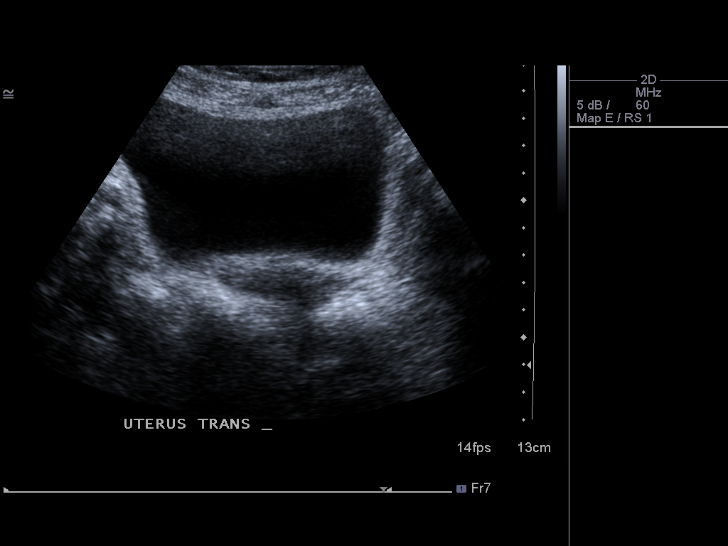
[im 9/53]
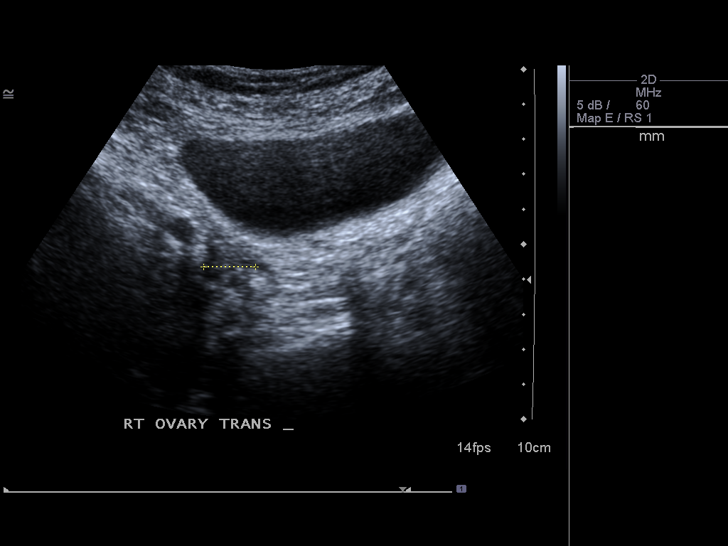
[im 14/53]
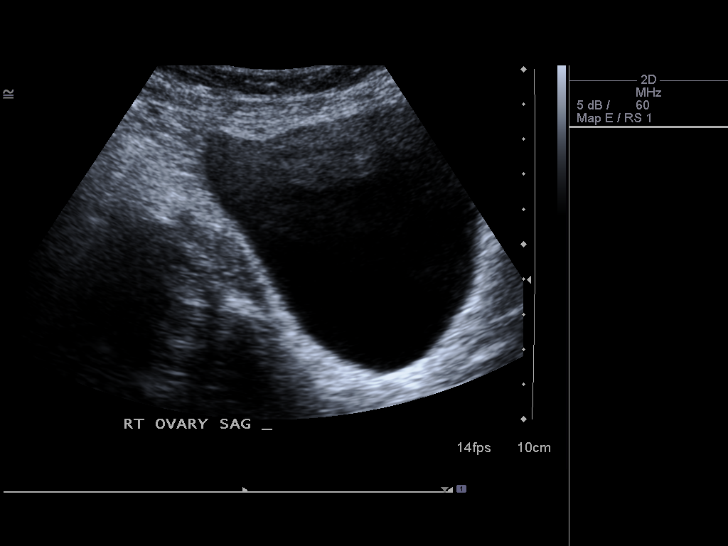
[im 18/53]
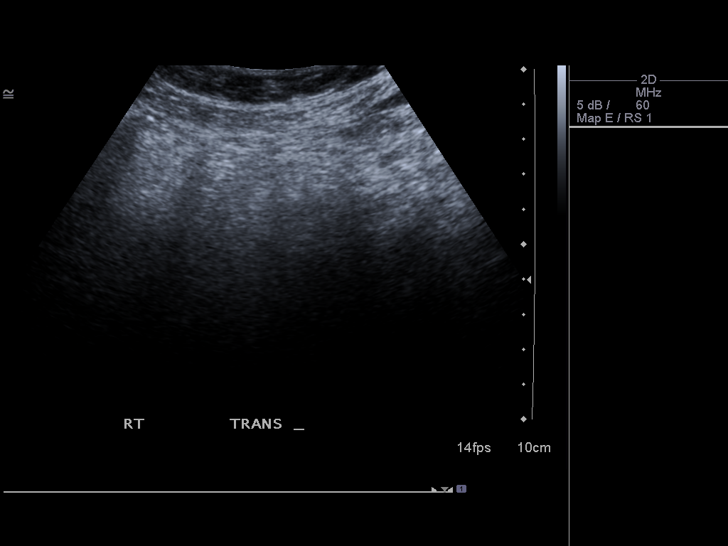
[im 20/53]
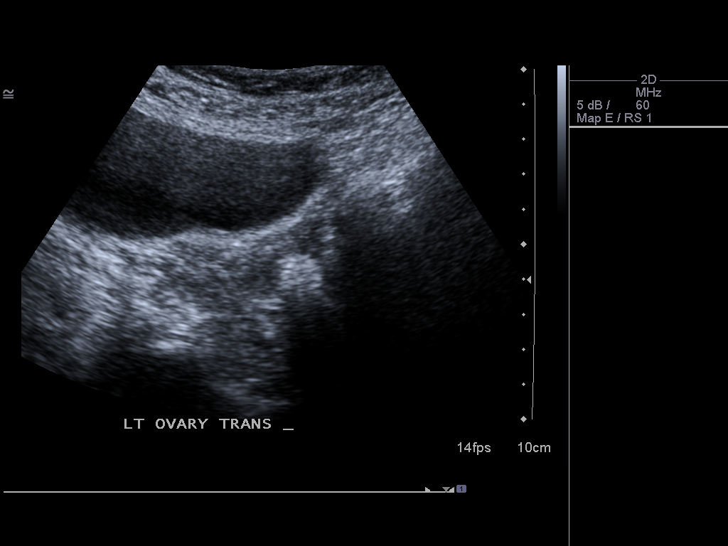
[im 24/53]
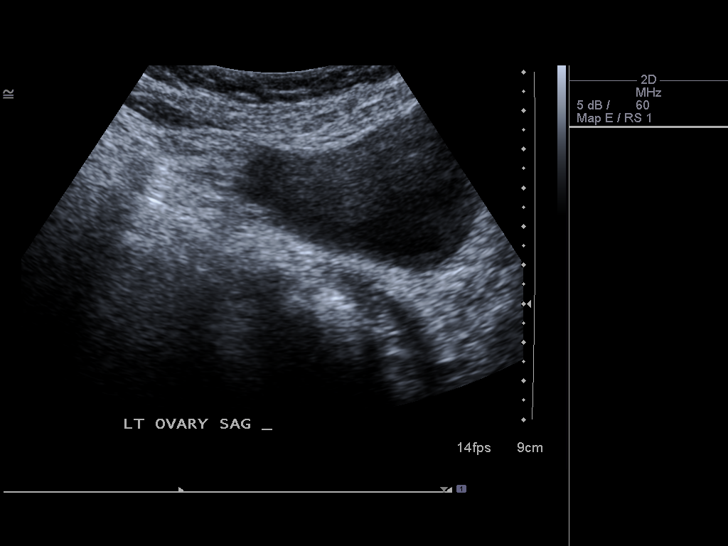
[im 29/53]
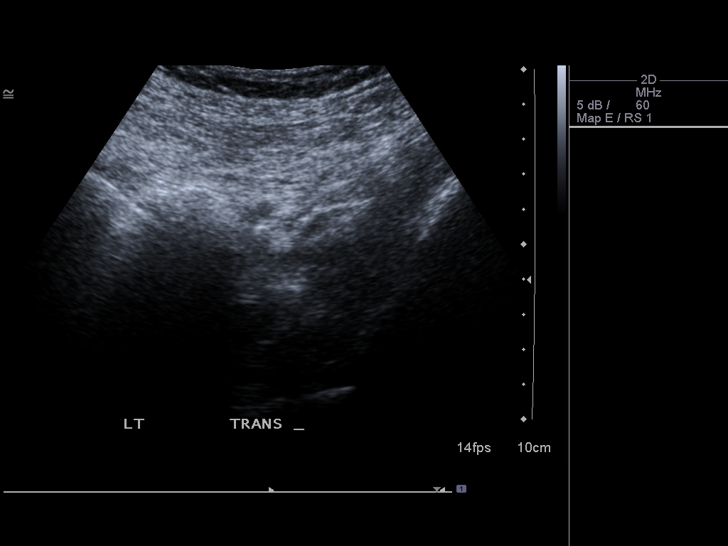
[im 33/53]
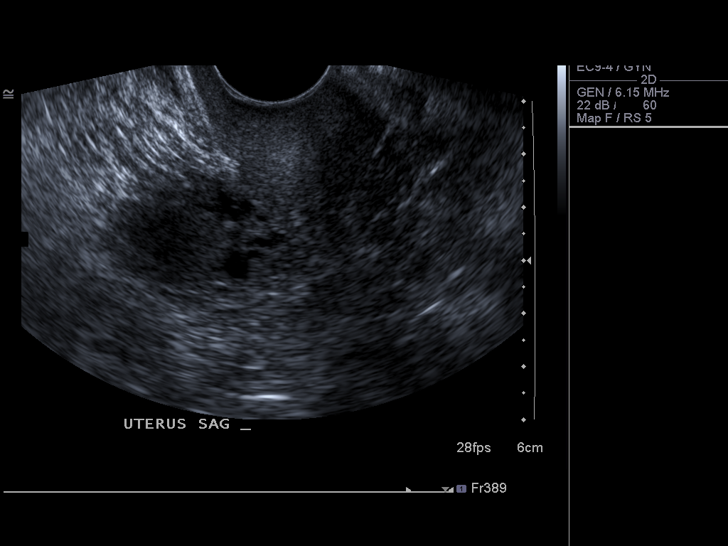
[im 35/53]
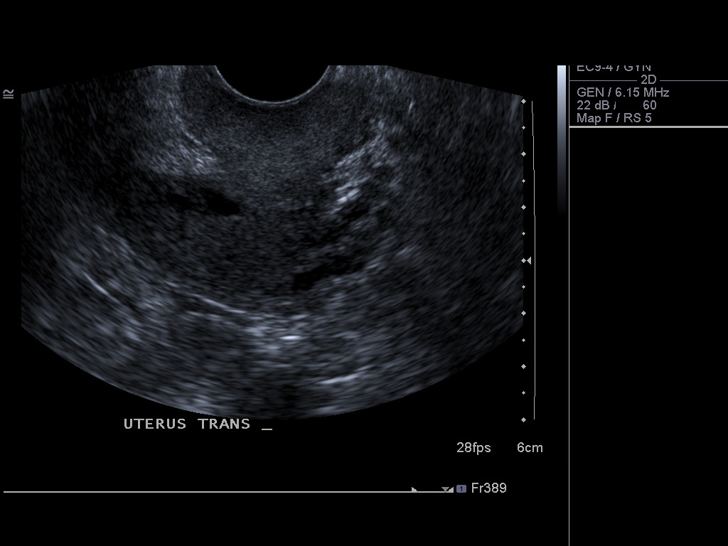
[im 40/53]
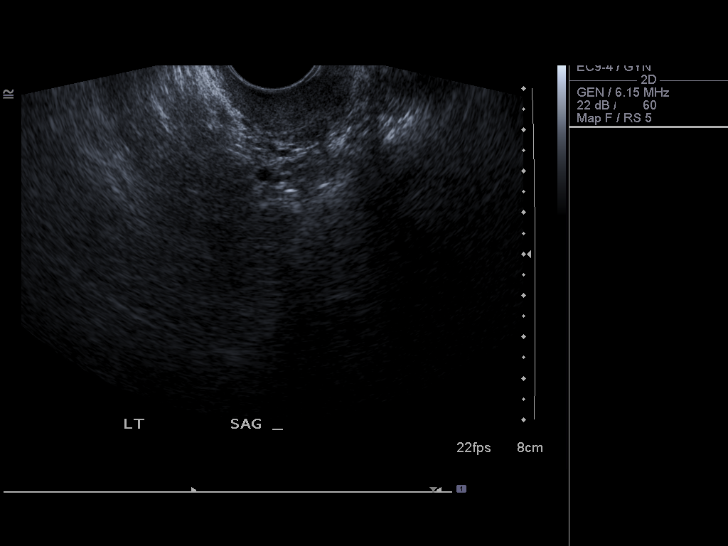
[im 44/53]
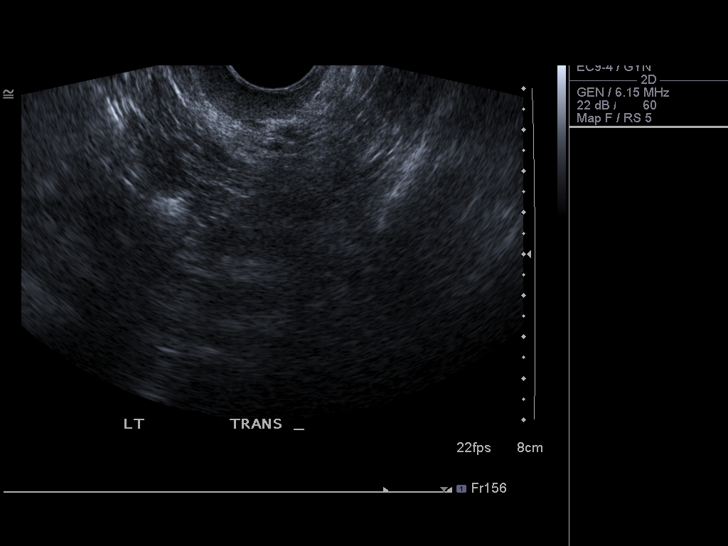
[im 48/53]
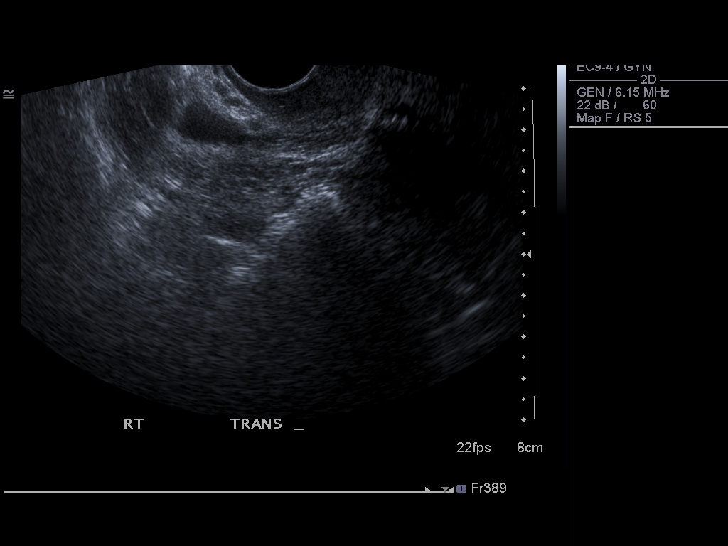
[im 53/53]
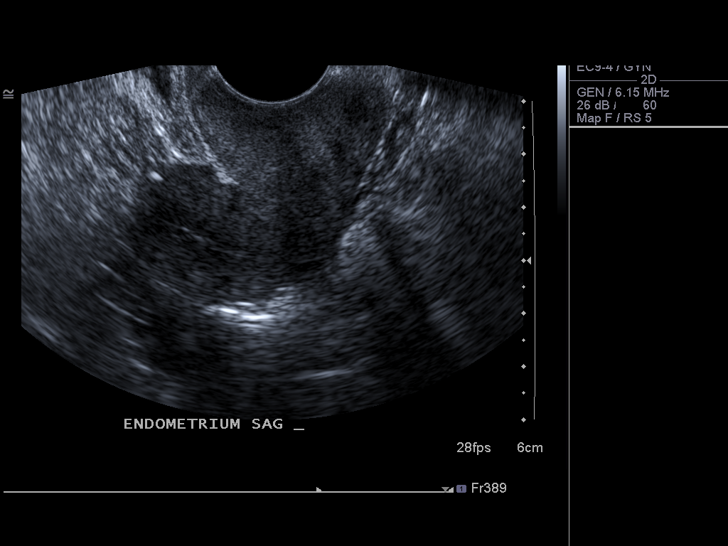

[14 of 25 positions shown; findings below may reference images not displayed]

FINDINGS: Uterus

Measurements: 6.9 x 1.9 x 3.6 cm. No fibroids or other mass
visualized.

Endometrium

Thickness: 2 mm.  No focal abnormality visualized.

Right ovary

Measurements: 2.1 x 1.0 x 1.5 cm. Normal appearance/no adnexal mass.

Left ovary

Measurements: 1.9 x 0.9 x 1.1 cm. Normal appearance/no adnexal mass.

Other findings

No free fluid.
IMPRESSION: Negative. No pelvic mass or other significant abnormality
visualized.

## 2014-08-13 ENCOUNTER — Encounter: Payer: BC Managed Care – PPO | Admitting: Family Medicine

## 2014-08-22 ENCOUNTER — Ambulatory Visit (INDEPENDENT_AMBULATORY_CARE_PROVIDER_SITE_OTHER): Payer: BC Managed Care – PPO | Admitting: Family Medicine

## 2014-08-22 ENCOUNTER — Encounter: Payer: Self-pay | Admitting: Family Medicine

## 2014-08-22 ENCOUNTER — Other Ambulatory Visit (HOSPITAL_COMMUNITY)
Admission: RE | Admit: 2014-08-22 | Discharge: 2014-08-22 | Disposition: A | Discharge status: Home / Self Care | Payer: BC Managed Care – PPO | Source: Ambulatory Visit | Attending: Family Medicine | Admitting: Family Medicine

## 2014-08-22 VITALS — BP 118/84 | HR 64 | Temp 98.1°F | Resp 16 | Ht 62.75 in | Wt 114.4 lb

## 2014-08-22 DIAGNOSIS — Z23 Encounter for immunization: Secondary | ICD-10-CM

## 2014-08-22 DIAGNOSIS — Z2911 Encounter for prophylactic immunotherapy for respiratory syncytial virus (RSV): Secondary | ICD-10-CM

## 2014-08-22 DIAGNOSIS — Z01419 Encounter for gynecological examination (general) (routine) without abnormal findings: Secondary | ICD-10-CM | POA: Insufficient documentation

## 2014-08-22 DIAGNOSIS — Z0000 Encounter for general adult medical examination without abnormal findings: Secondary | ICD-10-CM

## 2014-08-22 DIAGNOSIS — Z1151 Encounter for screening for human papillomavirus (HPV): Secondary | ICD-10-CM | POA: Diagnosis present

## 2014-08-22 DIAGNOSIS — Z124 Encounter for screening for malignant neoplasm of cervix: Secondary | ICD-10-CM | POA: Insufficient documentation

## 2014-08-22 LAB — CBC WITH DIFFERENTIAL/PLATELET
BASOS ABS: 0 10*3/uL (ref 0.0–0.1)
Basophils Relative: 0.4 % (ref 0.0–3.0)
EOS ABS: 0.1 10*3/uL (ref 0.0–0.7)
Eosinophils Relative: 1 % (ref 0.0–5.0)
HCT: 38.3 % (ref 36.0–46.0)
Hemoglobin: 12.9 g/dL (ref 12.0–15.0)
LYMPHS PCT: 37.5 % (ref 12.0–46.0)
Lymphs Abs: 2.4 10*3/uL (ref 0.7–4.0)
MCHC: 33.7 g/dL (ref 30.0–36.0)
MCV: 89 fl (ref 78.0–100.0)
MONOS PCT: 8 % (ref 3.0–12.0)
Monocytes Absolute: 0.5 10*3/uL (ref 0.1–1.0)
NEUTROS PCT: 53.1 % (ref 43.0–77.0)
Neutro Abs: 3.4 10*3/uL (ref 1.4–7.7)
PLATELETS: 301 10*3/uL (ref 150.0–400.0)
RBC: 4.31 Mil/uL (ref 3.87–5.11)
RDW: 13.2 % (ref 11.5–15.5)
WBC: 6.3 10*3/uL (ref 4.0–10.5)

## 2014-08-22 LAB — HEPATIC FUNCTION PANEL
ALBUMIN: 4.3 g/dL (ref 3.5–5.2)
ALK PHOS: 60 U/L (ref 39–117)
ALT: 16 U/L (ref 0–35)
AST: 21 U/L (ref 0–37)
BILIRUBIN DIRECT: 0 mg/dL (ref 0.0–0.3)
TOTAL PROTEIN: 8.1 g/dL (ref 6.0–8.3)
Total Bilirubin: 0.7 mg/dL (ref 0.2–1.2)

## 2014-08-22 LAB — BASIC METABOLIC PANEL
BUN: 16 mg/dL (ref 6–23)
CO2: 27 meq/L (ref 19–32)
Calcium: 9.4 mg/dL (ref 8.4–10.5)
Chloride: 102 mEq/L (ref 96–112)
Creatinine, Ser: 1 mg/dL (ref 0.4–1.2)
GFR: 62.06 mL/min (ref >60.00)
Glucose, Bld: 89 mg/dL (ref 70–99)
POTASSIUM: 3.8 meq/L (ref 3.5–5.1)
SODIUM: 137 meq/L (ref 135–145)

## 2014-08-22 LAB — LIPID PANEL
CHOLESTEROL: 295 mg/dL — AB (ref 0–200)
HDL: 96.8 mg/dL (ref >39.00)
LDL CALC: 179 mg/dL — AB (ref 0–99)
NonHDL: 198.2
TRIGLYCERIDES: 97 mg/dL (ref 0.0–149.0)
Total CHOL/HDL Ratio: 3
VLDL: 19.4 mg/dL (ref 0.0–40.0)

## 2014-08-22 LAB — VITAMIN D 25 HYDROXY (VIT D DEFICIENCY, FRACTURES): VITD: 34.72 ng/mL (ref 30.00–100.00)

## 2014-08-22 LAB — TSH: TSH: 1.23 u[IU]/mL (ref 0.35–4.50)

## 2014-08-22 MED ORDER — LEVOTHYROXINE SODIUM 50 MCG PO TABS: ORAL_TABLET | ORAL | Status: DC

## 2014-08-22 NOTE — Progress Notes (Signed)
Pre visit review using our clinic review tool, if applicable. No additional management support is needed unless otherwise documented below in the visit note. 

## 2014-08-22 NOTE — Patient Instructions (Signed)
Establish care with a new doctor in [place] You are due for your mammogram anytime after 9/19 and your bone density is due NEXT year. We'll notify you of your lab results and make any changes if needed Keep up the good work!  You look great! We'll miss you!  Good luck!

## 2014-08-22 NOTE — Assessment & Plan Note (Signed)
Pt's PE WNL.  UTD on mammo, DEXA, colonoscopy.  Pap collected today.  Check labs.  Anticipatory guidance provided.

## 2014-08-22 NOTE — Progress Notes (Signed)
   Subjective:    Patient ID: Kerri Garrett, female    DOB: 1951-11-27, 63 y.o.   MRN: [id number]  HPI CPE- pt is UTD on colonoscopy, due for mammo upcoming but pt is moving to [place] later today.   Review of Systems Patient reports no vision/ hearing changes, adenopathy,fever, weight change,  persistant/recurrent hoarseness , swallowing issues, chest pain, palpitations, edema, persistant/recurrent cough, hemoptysis, dyspnea (rest/exertional/paroxysmal nocturnal), gastrointestinal bleeding (melena, rectal bleeding), abdominal pain, significant heartburn, bowel changes, GU symptoms (dysuria, hematuria, incontinence), Gyn symptoms (abnormal  bleeding, pain),  syncope, focal weakness, memory loss, numbness & tingling, skin/hair/nail changes, abnormal bruising or bleeding, anxiety, or depression.     Objective:   Physical Exam  General Appearance:    Alert, cooperative, no distress, appears stated age  Head:    Normocephalic, without obvious abnormality, atraumatic  Eyes:    PERRL, conjunctiva/corneas clear, EOM's intact, fundi    benign, both eyes  Ears:    Normal TM's and external ear canals, both ears  Nose:   Nares normal, septum midline, mucosa normal, no drainage    or sinus tenderness  Throat:   Lips, mucosa, and tongue normal; teeth and gums normal  Neck:   Supple, symmetrical, trachea midline, no adenopathy;    Thyroid: no enlargement/tenderness/nodules  Back:     Symmetric, no curvature, ROM normal, no CVA tenderness  Lungs:     Clear to auscultation bilaterally, respirations unlabored  Chest Wall:    No tenderness or deformity   Heart:    Regular rate and rhythm, S1 and S2 normal, no murmur, rub   or gallop  Breast Exam:    No tenderness, masses, or nipple abnormality  Abdomen:     Soft, non-tender, bowel sounds active all four quadrants,    no masses, no organomegaly  Genitalia:    External genitalia normal, cervix normal in appearance, no CMT, uterus in normal  size and position, adnexa w/out mass or tenderness, mucosa pink and moist, no lesions or discharge present  Rectal:    Normal external appearance  Extremities:   Extremities normal, atraumatic, no cyanosis or edema  Pulses:   2+ and symmetric all extremities  Skin:   Skin color, texture, turgor normal, no rashes or lesions  Lymph nodes:   Cervical, supraclavicular, and axillary nodes normal  Neurologic:   CNII-XII intact, normal strength, sensation and reflexes    throughout          Assessment & Plan:

## 2014-08-22 NOTE — Assessment & Plan Note (Signed)
Pap collected. 

## 2014-08-23 LAB — CYTOLOGY - PAP

## 2014-08-28 ENCOUNTER — Encounter: Payer: BC Managed Care – PPO | Admitting: Family Medicine

## 2015-04-19 ENCOUNTER — Other Ambulatory Visit: Payer: Self-pay | Admitting: General Practice

## 2015-04-19 MED ORDER — LEVOTHYROXINE SODIUM 50 MCG PO TABS: ORAL_TABLET | ORAL | Status: AC

## 2015-05-17 ENCOUNTER — Telehealth: Payer: Self-pay | Admitting: *Deleted

## 2015-05-17 NOTE — Telephone Encounter (Signed)
Pt signed medical record release received via fax from [facility]/Dr. De HollingsheadLutin. Forwarded to [place] to scan/email to medical records. JG//CMA

## 2015-06-17 ENCOUNTER — Other Ambulatory Visit: Payer: Self-pay

## 2016-08-04 ENCOUNTER — Encounter: Payer: Self-pay | Admitting: *Deleted

## 2016-08-31 ENCOUNTER — Encounter: Payer: Self-pay | Admitting: Internal Medicine
